# Patient Record
Sex: Male | Born: 1940 | Marital: Single | Smoking: Current every day smoker
Health system: Southern US, Community
[De-identification: ages and names within clinical notes are randomized; demographics above are authoritative.]

## PROBLEM LIST (undated history)

## (undated) DIAGNOSIS — J449 Chronic obstructive pulmonary disease, unspecified: Secondary | ICD-10-CM

## (undated) DIAGNOSIS — R0600 Dyspnea, unspecified: Secondary | ICD-10-CM

## (undated) DIAGNOSIS — I10 Essential (primary) hypertension: Secondary | ICD-10-CM

## (undated) DIAGNOSIS — I219 Acute myocardial infarction, unspecified: Secondary | ICD-10-CM

## (undated) DIAGNOSIS — I4891 Unspecified atrial fibrillation: Secondary | ICD-10-CM

## (undated) DIAGNOSIS — I499 Cardiac arrhythmia, unspecified: Secondary | ICD-10-CM

## (undated) HISTORY — PX: OTHER SURGICAL HISTORY: SHX169

## (undated) HISTORY — PX: EXPLORATION POST OPERATIVE OPEN HEART: SHX5061

---

## 2016-08-22 ENCOUNTER — Other Ambulatory Visit: Payer: Self-pay

## 2016-11-13 ENCOUNTER — Emergency Department (HOSPITAL_COMMUNITY): Payer: Medicare PPO

## 2016-11-13 ENCOUNTER — Inpatient Hospital Stay (HOSPITAL_COMMUNITY)
Admission: EM | Admit: 2016-11-13 | Discharge: 2016-11-19 | DRG: 871 | Disposition: A | Discharge status: Skilled Nursing Facility | Payer: Medicare PPO | Attending: Internal Medicine | Admitting: Internal Medicine

## 2016-11-13 ENCOUNTER — Encounter (HOSPITAL_COMMUNITY): Payer: Self-pay | Admitting: Emergency Medicine

## 2016-11-13 DIAGNOSIS — J90 Pleural effusion, not elsewhere classified: Secondary | ICD-10-CM

## 2016-11-13 DIAGNOSIS — J449 Chronic obstructive pulmonary disease, unspecified: Secondary | ICD-10-CM | POA: Diagnosis present

## 2016-11-13 DIAGNOSIS — R748 Abnormal levels of other serum enzymes: Secondary | ICD-10-CM | POA: Diagnosis not present

## 2016-11-13 DIAGNOSIS — C3491 Malignant neoplasm of unspecified part of right bronchus or lung: Secondary | ICD-10-CM | POA: Diagnosis present

## 2016-11-13 DIAGNOSIS — Z9889 Other specified postprocedural states: Secondary | ICD-10-CM

## 2016-11-13 DIAGNOSIS — J181 Lobar pneumonia, unspecified organism: Secondary | ICD-10-CM

## 2016-11-13 DIAGNOSIS — E875 Hyperkalemia: Secondary | ICD-10-CM | POA: Diagnosis present

## 2016-11-13 DIAGNOSIS — I252 Old myocardial infarction: Secondary | ICD-10-CM

## 2016-11-13 DIAGNOSIS — B958 Unspecified staphylococcus as the cause of diseases classified elsewhere: Secondary | ICD-10-CM

## 2016-11-13 DIAGNOSIS — Z833 Family history of diabetes mellitus: Secondary | ICD-10-CM | POA: Diagnosis not present

## 2016-11-13 DIAGNOSIS — I214 Non-ST elevation (NSTEMI) myocardial infarction: Secondary | ICD-10-CM | POA: Diagnosis present

## 2016-11-13 DIAGNOSIS — I712 Thoracic aortic aneurysm, without rupture: Secondary | ICD-10-CM | POA: Diagnosis present

## 2016-11-13 DIAGNOSIS — I4510 Unspecified right bundle-branch block: Secondary | ICD-10-CM | POA: Diagnosis present

## 2016-11-13 DIAGNOSIS — J910 Malignant pleural effusion: Secondary | ICD-10-CM | POA: Diagnosis present

## 2016-11-13 DIAGNOSIS — J15212 Pneumonia due to Methicillin resistant Staphylococcus aureus: Secondary | ICD-10-CM | POA: Diagnosis present

## 2016-11-13 DIAGNOSIS — I350 Nonrheumatic aortic (valve) stenosis: Secondary | ICD-10-CM | POA: Diagnosis not present

## 2016-11-13 DIAGNOSIS — N179 Acute kidney failure, unspecified: Secondary | ICD-10-CM | POA: Diagnosis present

## 2016-11-13 DIAGNOSIS — Z951 Presence of aortocoronary bypass graft: Secondary | ICD-10-CM

## 2016-11-13 DIAGNOSIS — I2510 Atherosclerotic heart disease of native coronary artery without angina pectoris: Secondary | ICD-10-CM | POA: Diagnosis present

## 2016-11-13 DIAGNOSIS — A419 Sepsis, unspecified organism: Secondary | ICD-10-CM | POA: Diagnosis present

## 2016-11-13 DIAGNOSIS — J918 Pleural effusion in other conditions classified elsewhere: Secondary | ICD-10-CM

## 2016-11-13 DIAGNOSIS — I330 Acute and subacute infective endocarditis: Secondary | ICD-10-CM | POA: Diagnosis present

## 2016-11-13 DIAGNOSIS — R7881 Bacteremia: Secondary | ICD-10-CM | POA: Diagnosis present

## 2016-11-13 DIAGNOSIS — Z8249 Family history of ischemic heart disease and other diseases of the circulatory system: Secondary | ICD-10-CM | POA: Diagnosis not present

## 2016-11-13 DIAGNOSIS — I4891 Unspecified atrial fibrillation: Secondary | ICD-10-CM

## 2016-11-13 DIAGNOSIS — F17210 Nicotine dependence, cigarettes, uncomplicated: Secondary | ICD-10-CM | POA: Diagnosis present

## 2016-11-13 DIAGNOSIS — Z9981 Dependence on supplemental oxygen: Secondary | ICD-10-CM | POA: Diagnosis not present

## 2016-11-13 DIAGNOSIS — J440 Chronic obstructive pulmonary disease with acute lower respiratory infection: Secondary | ICD-10-CM | POA: Diagnosis present

## 2016-11-13 DIAGNOSIS — A4102 Sepsis due to Methicillin resistant Staphylococcus aureus: Secondary | ICD-10-CM | POA: Diagnosis present

## 2016-11-13 DIAGNOSIS — I110 Hypertensive heart disease with heart failure: Secondary | ICD-10-CM | POA: Diagnosis present

## 2016-11-13 DIAGNOSIS — I10 Essential (primary) hypertension: Secondary | ICD-10-CM | POA: Diagnosis present

## 2016-11-13 DIAGNOSIS — J9601 Acute respiratory failure with hypoxia: Secondary | ICD-10-CM | POA: Diagnosis present

## 2016-11-13 DIAGNOSIS — I5033 Acute on chronic diastolic (congestive) heart failure: Secondary | ICD-10-CM | POA: Diagnosis present

## 2016-11-13 DIAGNOSIS — I480 Paroxysmal atrial fibrillation: Secondary | ICD-10-CM | POA: Diagnosis present

## 2016-11-13 DIAGNOSIS — B9562 Methicillin resistant Staphylococcus aureus infection as the cause of diseases classified elsewhere: Secondary | ICD-10-CM | POA: Diagnosis present

## 2016-11-13 DIAGNOSIS — R778 Other specified abnormalities of plasma proteins: Secondary | ICD-10-CM | POA: Diagnosis present

## 2016-11-13 DIAGNOSIS — R7989 Other specified abnormal findings of blood chemistry: Secondary | ICD-10-CM | POA: Diagnosis present

## 2016-11-13 DIAGNOSIS — B370 Candidal stomatitis: Secondary | ICD-10-CM | POA: Diagnosis present

## 2016-11-13 DIAGNOSIS — J189 Pneumonia, unspecified organism: Secondary | ICD-10-CM | POA: Diagnosis not present

## 2016-11-13 DIAGNOSIS — I2583 Coronary atherosclerosis due to lipid rich plaque: Secondary | ICD-10-CM

## 2016-11-13 DIAGNOSIS — E222 Syndrome of inappropriate secretion of antidiuretic hormone: Secondary | ICD-10-CM | POA: Diagnosis present

## 2016-11-13 DIAGNOSIS — Z7982 Long term (current) use of aspirin: Secondary | ICD-10-CM | POA: Diagnosis not present

## 2016-11-13 DIAGNOSIS — I351 Nonrheumatic aortic (valve) insufficiency: Secondary | ICD-10-CM | POA: Diagnosis present

## 2016-11-13 DIAGNOSIS — E871 Hypo-osmolality and hyponatremia: Secondary | ICD-10-CM | POA: Diagnosis not present

## 2016-11-13 DIAGNOSIS — I482 Chronic atrial fibrillation: Secondary | ICD-10-CM | POA: Diagnosis present

## 2016-11-13 HISTORY — DX: Cardiac arrhythmia, unspecified: I49.9

## 2016-11-13 HISTORY — DX: Unspecified atrial fibrillation: I48.91

## 2016-11-13 HISTORY — DX: Acute myocardial infarction, unspecified: I21.9

## 2016-11-13 HISTORY — DX: Chronic obstructive pulmonary disease, unspecified: J44.9

## 2016-11-13 HISTORY — DX: Dyspnea, unspecified: R06.00

## 2016-11-13 HISTORY — DX: Essential (primary) hypertension: I10

## 2016-11-13 LAB — CBC WITH DIFFERENTIAL/PLATELET
BASOS ABS: 0 10*3/uL (ref 0.0–0.1)
Basophils Relative: 0 %
EOS ABS: 0 10*3/uL (ref 0.0–0.7)
EOS PCT: 0 %
HCT: 48.2 % (ref 39.0–52.0)
HEMOGLOBIN: 15.6 g/dL (ref 13.0–17.0)
LYMPHS ABS: 0.9 10*3/uL (ref 0.7–4.0)
LYMPHS PCT: 5 %
MCH: 26.6 pg (ref 26.0–34.0)
MCHC: 32.4 g/dL (ref 30.0–36.0)
MCV: 82.1 fL (ref 78.0–100.0)
Monocytes Absolute: 0.6 10*3/uL (ref 0.1–1.0)
Monocytes Relative: 3 %
NEUTROS PCT: 92 %
Neutro Abs: 16 10*3/uL — ABNORMAL HIGH (ref 1.7–7.7)
PLATELETS: 286 10*3/uL (ref 150–400)
RBC: 5.87 MIL/uL — AB (ref 4.22–5.81)
RDW: 15.2 % (ref 11.5–15.5)
WBC: 17.5 10*3/uL — AB (ref 4.0–10.5)

## 2016-11-13 LAB — I-STAT CG4 LACTIC ACID, ED
LACTIC ACID, VENOUS: 1.86 mmol/L (ref 0.5–1.9)
Lactic Acid, Venous: 2.1 mmol/L (ref 0.5–1.9)

## 2016-11-13 LAB — OSMOLALITY: Osmolality: 296 mOsm/kg — ABNORMAL HIGH (ref 275–295)

## 2016-11-13 LAB — URINALYSIS, ROUTINE W REFLEX MICROSCOPIC
Bilirubin Urine: NEGATIVE
GLUCOSE, UA: NEGATIVE mg/dL
Ketones, ur: NEGATIVE mg/dL
LEUKOCYTES UA: NEGATIVE
NITRITE: NEGATIVE
PH: 5 (ref 5.0–8.0)
PROTEIN: NEGATIVE mg/dL
SPECIFIC GRAVITY, URINE: 1.01 (ref 1.005–1.030)
Squamous Epithelial / LPF: NONE SEEN
WBC, UA: NONE SEEN WBC/hpf (ref 0–5)

## 2016-11-13 LAB — COMPREHENSIVE METABOLIC PANEL
ALBUMIN: 2.1 g/dL — AB (ref 3.5–5.0)
ALT: 25 U/L (ref 17–63)
ANION GAP: 9 (ref 5–15)
AST: 37 U/L (ref 15–41)
Alkaline Phosphatase: 186 U/L — ABNORMAL HIGH (ref 38–126)
BUN: 53 mg/dL — ABNORMAL HIGH (ref 6–20)
CO2: 22 mmol/L (ref 22–32)
Calcium: 8.8 mg/dL — ABNORMAL LOW (ref 8.9–10.3)
Chloride: 94 mmol/L — ABNORMAL LOW (ref 101–111)
Creatinine, Ser: 1.46 mg/dL — ABNORMAL HIGH (ref 0.61–1.24)
GFR calc non Af Amer: 45 mL/min — ABNORMAL LOW (ref >60)
GFR, EST AFRICAN AMERICAN: 52 mL/min — AB (ref >60)
GLUCOSE: 117 mg/dL — AB (ref 65–99)
POTASSIUM: 5.9 mmol/L — AB (ref 3.5–5.1)
SODIUM: 125 mmol/L — AB (ref 135–145)
TOTAL PROTEIN: 6.9 g/dL (ref 6.5–8.1)
Total Bilirubin: 1 mg/dL (ref 0.3–1.2)

## 2016-11-13 LAB — BRAIN NATRIURETIC PEPTIDE: B Natriuretic Peptide: 193.6 pg/mL — ABNORMAL HIGH (ref 0.0–100.0)

## 2016-11-13 LAB — MAGNESIUM: MAGNESIUM: 2.1 mg/dL (ref 1.7–2.4)

## 2016-11-13 LAB — TROPONIN I
TROPONIN I: 0.07 ng/mL — AB (ref <0.03)
Troponin I: 0.04 ng/mL (ref <0.03)

## 2016-11-13 LAB — POTASSIUM: POTASSIUM: 5.9 mmol/L — AB (ref 3.5–5.1)

## 2016-11-13 LAB — PROTIME-INR
INR: 1.14
PROTHROMBIN TIME: 14.7 s (ref 11.4–15.2)

## 2016-11-13 MED ORDER — TIOTROPIUM BROMIDE MONOHYDRATE 18 MCG IN CAPS
18.0000 ug | ORAL_CAPSULE | Freq: Every day | RESPIRATORY_TRACT | Status: DC
  Administered 2016-11-15 – 2016-11-18 (×4): 18 ug via RESPIRATORY_TRACT
  Filled 2016-11-13: qty 5

## 2016-11-13 MED ORDER — NYSTATIN 100000 UNIT/ML MT SUSP
5.0000 mL | Freq: Four times a day (QID) | OROMUCOSAL | Status: DC
  Administered 2016-11-13: 500000 [IU] via ORAL
  Filled 2016-11-13: qty 5

## 2016-11-13 MED ORDER — ALBUTEROL SULFATE (2.5 MG/3ML) 0.083% IN NEBU: 2.5000 mg | INHALATION_SOLUTION | RESPIRATORY_TRACT | Status: DC | PRN

## 2016-11-13 MED ORDER — METOPROLOL TARTRATE 12.5 MG HALF TABLET
12.5000 mg | ORAL_TABLET | Freq: Two times a day (BID) | ORAL | Status: DC
  Administered 2016-11-14 – 2016-11-16 (×6): 12.5 mg via ORAL
  Filled 2016-11-13 (×6): qty 1

## 2016-11-13 MED ORDER — ONDANSETRON HCL 4 MG/2ML IJ SOLN: 4.0000 mg | Freq: Four times a day (QID) | INTRAMUSCULAR | Status: DC | PRN

## 2016-11-13 MED ORDER — DEXTROSE 5 % IV SOLN
1.0000 g | Freq: Once | INTRAVENOUS | Status: AC
  Administered 2016-11-13: 1 g via INTRAVENOUS
  Filled 2016-11-13: qty 10

## 2016-11-13 MED ORDER — PREDNISONE 20 MG PO TABS
40.0000 mg | ORAL_TABLET | Freq: Every day | ORAL | Status: DC
  Administered 2016-11-14: 40 mg via ORAL
  Filled 2016-11-13: qty 2

## 2016-11-13 MED ORDER — LEVOFLOXACIN IN D5W 500 MG/100ML IV SOLN
500.0000 mg | Freq: Once | INTRAVENOUS | Status: AC
  Administered 2016-11-13: 500 mg via INTRAVENOUS
  Filled 2016-11-13: qty 100

## 2016-11-13 MED ORDER — AZITHROMYCIN 500 MG IV SOLR
500.0000 mg | INTRAVENOUS | Status: DC
  Administered 2016-11-14: 500 mg via INTRAVENOUS
  Filled 2016-11-13 (×2): qty 500

## 2016-11-13 MED ORDER — DEXAMETHASONE SODIUM PHOSPHATE 10 MG/ML IJ SOLN
10.0000 mg | Freq: Once | INTRAMUSCULAR | Status: AC
Start: 2016-11-13 — End: 2016-11-13
  Administered 2016-11-13: 10 mg via INTRAVENOUS
  Filled 2016-11-13: qty 1

## 2016-11-13 MED ORDER — ONDANSETRON HCL 4 MG PO TABS: 4.0000 mg | ORAL_TABLET | Freq: Four times a day (QID) | ORAL | Status: DC | PRN

## 2016-11-13 MED ORDER — FUROSEMIDE 10 MG/ML IJ SOLN
40.0000 mg | INTRAMUSCULAR | Status: AC
  Administered 2016-11-13: 40 mg via INTRAVENOUS
  Filled 2016-11-13: qty 4

## 2016-11-13 MED ORDER — ASPIRIN EC 81 MG PO TBEC
81.0000 mg | DELAYED_RELEASE_TABLET | Freq: Every day | ORAL | Status: DC
  Administered 2016-11-14 – 2016-11-19 (×6): 81 mg via ORAL
  Filled 2016-11-13 (×6): qty 1

## 2016-11-13 MED ORDER — IOPAMIDOL (ISOVUE-300) INJECTION 61%
INTRAVENOUS | Status: AC
  Administered 2016-11-14: 100 mL via INTRAVENOUS
  Filled 2016-11-13: qty 100

## 2016-11-13 MED ORDER — SODIUM CHLORIDE 0.9 % IV SOLN
INTRAVENOUS | Status: DC
  Administered 2016-11-13: 23:00:00 via INTRAVENOUS

## 2016-11-13 MED ORDER — IPRATROPIUM-ALBUTEROL 0.5-2.5 (3) MG/3ML IN SOLN
3.0000 mL | Freq: Once | RESPIRATORY_TRACT | Status: AC
  Administered 2016-11-13: 3 mL via RESPIRATORY_TRACT
  Filled 2016-11-13: qty 3

## 2016-11-13 MED ORDER — DEXTROSE 5 % IV SOLN
1.0000 g | INTRAVENOUS | Status: DC
  Administered 2016-11-14: 1 g via INTRAVENOUS
  Filled 2016-11-13 (×2): qty 10

## 2016-11-13 MED ORDER — ACETAMINOPHEN 325 MG PO TABS: 650.0000 mg | ORAL_TABLET | Freq: Four times a day (QID) | ORAL | Status: DC | PRN

## 2016-11-13 MED ORDER — TAMSULOSIN HCL 0.4 MG PO CAPS
0.4000 mg | ORAL_CAPSULE | Freq: Two times a day (BID) | ORAL | Status: DC
  Administered 2016-11-14 – 2016-11-19 (×12): 0.4 mg via ORAL
  Filled 2016-11-13 (×12): qty 1

## 2016-11-13 MED ORDER — NON FORMULARY: Freq: Every day | Status: DC

## 2016-11-13 MED ORDER — ENOXAPARIN SODIUM 40 MG/0.4ML ~~LOC~~ SOLN: 40.0000 mg | SUBCUTANEOUS | Status: DC

## 2016-11-13 MED ORDER — ARFORMOTEROL TARTRATE 15 MCG/2ML IN NEBU
15.0000 ug | INHALATION_SOLUTION | Freq: Two times a day (BID) | RESPIRATORY_TRACT | Status: DC
  Administered 2016-11-14 – 2016-11-18 (×8): 15 ug via RESPIRATORY_TRACT
  Filled 2016-11-13 (×10): qty 2

## 2016-11-13 MED ORDER — NICOTINE 21 MG/24HR TD PT24: 21.0000 mg | MEDICATED_PATCH | Freq: Every day | TRANSDERMAL | Status: DC

## 2016-11-13 MED ORDER — ACETAMINOPHEN 650 MG RE SUPP: 650.0000 mg | Freq: Four times a day (QID) | RECTAL | Status: DC | PRN

## 2016-11-13 MED ORDER — GABAPENTIN 600 MG PO TABS
600.0000 mg | ORAL_TABLET | Freq: Three times a day (TID) | ORAL | Status: DC
  Administered 2016-11-14 – 2016-11-19 (×17): 600 mg via ORAL
  Filled 2016-11-13 (×17): qty 1

## 2016-11-13 NOTE — ED Notes (Signed)
EDP aware of BP.

## 2016-11-13 NOTE — ED Notes (Signed)
Attempted report x1. 

## 2016-11-13 NOTE — ED Notes (Signed)
ED Provider at bedside. 

## 2016-11-13 NOTE — Progress Notes (Signed)
Pharmacy Antibiotic Note  Mike Becker is a 76 y.o. male admitted on 11/13/2016 with pneumonia.  Pharmacy has been consulted for Ceftriaxone/Azithromycin dosing. WBC elevated. CXR with airspace opacity.   Plan: -Ceftriaxone 1g IV q24h -Azithromycin 500 mg IV q24h -Trend WBC, temp -F/U infectious work-up  Height: 6\' 3"  (190.5 cm) Weight: 178 lb (80.7 kg) IBW/kg (Calculated) : 84.5  Temp (24hrs), Avg:98.4 F (36.9 C), Min:97.3 F (36.3 C), Max:99.4 F (37.4 C)   Recent Labs Lab 11/13/16 1714 11/13/16 1729 11/13/16 1944  WBC 17.5*  --   --   CREATININE 1.46*  --   --   LATICACIDVEN  --  2.10* 1.86    Estimated Creatinine Clearance: 48.4 mL/min (A) (by C-G formula based on SCr of 1.46 mg/dL (H)).    No Known Allergies   Mike Becker 11/13/2016 10:31 PM

## 2016-11-13 NOTE — ED Notes (Signed)
Per main lab, will add on BNP

## 2016-11-13 NOTE — ED Notes (Addendum)
Per 3W Charge RN and [facility], pt inpatient bed in need of approval. Pt to remain in ED and continue to receive care in ED until transported upstairs. Explained delay to family.

## 2016-11-13 NOTE — ED Notes (Signed)
XR at bedside

## 2016-11-13 NOTE — ED Notes (Signed)
Admitting at bedside 

## 2016-11-13 NOTE — ED Provider Notes (Signed)
[facility] DEPT Provider Note   CSN: [id number] Arrival date & time: 11/13/16  [place]     History   Chief Complaint Chief Complaint  Patient presents with  . Shortness of Breath  . Leg Swelling  . Weakness  . Diarrhea    HPI Mike Becker is a 76 y.o. male.  76 year old male with past medical history including COPD, CAD s/p MI and CABG, A fib not on anticoagulation who p/w shortness of breath, leg swelling, and diarrhea. Patient reports a 2 week history of progressively worsening shortness of breath. He chronically has a productive cough that may be slightly worse recently. He reports brown sputum production that no associated fevers or chest pain. His shortness of breath is worse with exertion. He endorses orthopnea and paroxysmal nocturnal dyspnea as well as 2 weeks of worsening bilateral lower extremity edema. He reports 1 week of nonbloody diarrhea, no associated vomiting. No recent travel or sick contacts. He states that a long time ago he was in the ED for atrial fibrillation and they wanted to start him on several toe but he never took the medication. He has been out of Lasix for the past one month but has taken the rest of his medications.   The history is provided by the patient.  Shortness of Breath   Weakness  Associated symptoms include shortness of breath.  Diarrhea      Past Medical History:  Diagnosis Date  . A-fib ([place])   . COPD (chronic obstructive pulmonary disease) ([place])   . MI (myocardial infarction) [facility])     Patient Active Problem List   Diagnosis Date Noted  . Sepsis ([place]) 11/13/2016    Past Surgical History:  Procedure Laterality Date  . EXPLORATION POST OPERATIVE OPEN HEART    . triple bypass         Home Medications    Prior to Admission medications   Medication Sig Start Date End Date Taking? Authorizing Provider  aspirin EC 81 MG tablet Take 81 mg by mouth daily.   Yes [provider]  gabapentin (NEURONTIN) 600 MG  tablet Take 600 mg by mouth 3 (three) times daily.   Yes [provider]  ibuprofen (ADVIL,MOTRIN) 200 MG tablet Take 200 mg by mouth every 6 (six) hours as needed for headache (pain).   Yes [provider]  lisinopril (PRINIVIL,ZESTRIL) 40 MG tablet Take 20 mg by mouth at bedtime.   Yes [provider]  metoprolol tartrate (LOPRESSOR) 25 MG tablet Take 12.5 mg by mouth 2 (two) times daily.   Yes [provider]  mometasone (ASMANEX) 220 MCG/INH inhaler Inhale 2 puffs into the lungs at bedtime. Rinse mouth well with water after each use   Yes [provider]  Omega-3 Fatty Acids (OMEGA 3 PO) Take 1 capsule by mouth daily.   Yes [provider]  OVER THE COUNTER MEDICATION Take 1 capsule by mouth See admin instructions. Take 1 capsule by mouth every morning - supplement for brain/memory   Yes [provider]  OVER THE COUNTER MEDICATION Place 1 drop into both eyes daily. Over the counter eye drop for red eyes   Yes [provider]  tamsulosin (FLOMAX) 0.4 MG CAPS capsule Take 0.4 mg by mouth 2 (two) times daily.   Yes [provider]  Tiotropium Bromide-Olodaterol (STIOLTO RESPIMAT) 2.5-2.5 MCG/ACT AERS Inhale 2 puffs into the lungs daily.   Yes [provider]  furosemide (LASIX) 20 MG tablet Take 20 mg by mouth  See admin instructions. Take 1 tablet (20 mg) by mouth daily as needed for leg swelling or one pound weight gain in 24 hours    [provider]    Family History No family history on file.  Social History Social History  Substance Use Topics  . Smoking status: Current Every Day Smoker  . Smokeless tobacco: Never Used  . Alcohol use No     Allergies   Patient has no known allergies.   Review of Systems Review of Systems  Respiratory: Positive for shortness of breath.   Gastrointestinal: Positive for diarrhea.  Neurological: Positive for weakness.   All other systems reviewed  and are negative except that which was mentioned in HPI   Physical Exam Updated Vital Signs BP 123/76   Pulse (!) 102   Temp (S) 99.4 F (37.4 C) (Rectal)   Resp (!) 22   Ht 6\' 3"  (1.905 m)   Wt 80.7 kg (178 lb)   SpO2 96%   BMI 22.25 kg/m   Physical Exam  Constitutional: He is oriented to person, place, and time. He appears well-developed. No distress.  Thin, chronically ill appearing but NAD  HENT:  Head: Normocephalic and atraumatic.  Eyes: Conjunctivae are normal. Pupils are equal, round, and reactive to light.  Neck: Neck supple.  Cardiovascular: Normal heart sounds.  An irregularly irregular rhythm present. Tachycardia present.   No murmur heard. Pulmonary/Chest:  Mildly increased WOB, expiratory wheezes, crackles in b/l bases, severely diminished R>L lung  Abdominal: Soft. Bowel sounds are normal. He exhibits no distension. There is no tenderness.  Musculoskeletal: He exhibits edema (2+ pitting to b/l knees).  Neurological: He is alert and oriented to person, place, and time.  Fluent speech  Skin: Skin is warm and dry.  Psychiatric: He has a normal mood and affect. Judgment normal.  Nursing note and vitals reviewed.    ED Treatments / Results  Labs (all labs ordered are listed, but only abnormal results are displayed) Labs Reviewed  COMPREHENSIVE METABOLIC PANEL - Abnormal; Notable for the following:       Result Value   Sodium 125 (*)    Potassium 5.9 (*)    Chloride 94 (*)    Glucose, Bld 117 (*)    BUN 53 (*)    Creatinine, Ser 1.46 (*)    Calcium 8.8 (*)    Albumin 2.1 (*)    Alkaline Phosphatase 186 (*)    GFR calc non Af Amer 45 (*)    GFR calc Af Amer 52 (*)    All other components within normal limits  CBC WITH DIFFERENTIAL/PLATELET - Abnormal; Notable for the following:    WBC 17.5 (*)    RBC 5.87 (*)    Neutro Abs 16.0 (*)    All other components within normal limits  URINALYSIS, ROUTINE W REFLEX MICROSCOPIC - Abnormal; Notable for the  following:    Hgb urine dipstick MODERATE (*)    Bacteria, UA RARE (*)    All other components within normal limits  TROPONIN I - Abnormal; Notable for the following:    Troponin I 0.07 (*)    All other components within normal limits  POTASSIUM - Abnormal; Notable for the following:    Potassium 5.9 (*)    All other components within normal limits  BRAIN NATRIURETIC PEPTIDE - Abnormal; Notable for the following:    B Natriuretic Peptide 193.6 (*)    All other components within normal limits  I-STAT CG4 LACTIC ACID,  ED - Abnormal; Notable for the following:    Lactic Acid, Venous 2.10 (*)    All other components within normal limits  CULTURE, BLOOD (ROUTINE X 2)  CULTURE, BLOOD (ROUTINE X 2)  URINE CULTURE  PROTIME-INR  I-STAT CG4 LACTIC ACID, ED    EKG  EKG Interpretation  Date/Time:  Saturday November 13 2016 16:52:20 EDT Ventricular Rate:  145 PR Interval:    QRS Duration: 124 QT Interval:  316 QTC Calculation: 490 R Axis:   128 Text Interpretation:  Atrial fibrillation with rapid ventricular response Right bundle branch block Abnormal ECG No previous ECGs available Confirmed by Theotis Burrow [phone]) on 11/13/2016 5:12:15 PM       Radiology Dg Chest Port 1 View  Result Date: 11/13/2016 CLINICAL DATA:  Weakness and swelling to both feet. Shortness of breath. EXAM: PORTABLE CHEST 1 VIEW COMPARISON:  None. FINDINGS: Left lung appears hyperexpanded with underlying chronic interstitial changes. There is right base collapse/ consolidation with moderate right pleural effusion. Patient is status post CABG. Bones are diffusely demineralized. Telemetry leads overlie the chest. IMPRESSION: 1. Airspace opacity at the right base with associated moderate right pleural effusion. Given unilateral round IV of findings, neoplasm must be considered. CT chest with contrast may prove helpful to further evaluate. Electronically Signed   By: Misty Stanley M.D.   On: 11/13/2016 17:36     Procedures .Critical Care Performed by: Sharlett Iles Authorized by: Sharlett Iles   Critical care provider statement:    Critical care time (minutes):  40   Critical care time was exclusive of:  Separately billable procedures and treating other patients   Critical care was necessary to treat or prevent imminent or life-threatening deterioration of the following conditions:  Sepsis, respiratory failure and cardiac failure   Critical care was time spent personally by me on the following activities:  Development of treatment plan with patient or surrogate, evaluation of patient's response to treatment, examination of patient, obtaining history from patient or surrogate, ordering and performing treatments and interventions, ordering and review of laboratory studies, ordering and review of radiographic studies and re-evaluation of patient's condition   (including critical care time)  Medications Ordered in ED Medications  cefTRIAXone (ROCEPHIN) 1 g in dextrose 5 % 50 mL IVPB (not administered)  nystatin (MYCOSTATIN) 100000 UNIT/ML suspension 500,000 Units (500,000 Units Oral Given 11/13/16 1947)  furosemide (LASIX) injection 40 mg (40 mg Intravenous Given 11/13/16 1747)  ipratropium-albuterol (DUONEB) 0.5-2.5 (3) MG/3ML nebulizer solution 3 mL (3 mLs Nebulization Given 11/13/16 1748)  dexamethasone (DECADRON) injection 10 mg (10 mg Intravenous Given 11/13/16 1747)  cefTRIAXone (ROCEPHIN) 1 g in dextrose 5 % 50 mL IVPB (0 g Intravenous Stopped 11/13/16 1827)  levofloxacin (LEVAQUIN) IVPB 500 mg (0 mg Intravenous Stopped 11/13/16 1936)     Initial Impression / Assessment and Plan / ED Course  I have reviewed the triage vital signs and the nursing notes.  Pertinent labs & imaging results that were available during my care of the patient were reviewed by me and considered in my medical decision making (see chart for details).    Pt w/ CAD, COPD not on O2, h/o A fib but not  anticoagulated p/w worsening SOB, leg swelling, and diarrhea. At triage, he was 85% on RA, placed on 5L [place]. Initial BP 88/59, sBP improved to 107 during my exam. HR irregular, a fib on monitor, rate ~100. He had wheezes, crackles, and evidence of volume overload on exam. Initial lactate 2.1,  initiated code sepsis given abnormal VS, hypoxia, and productive cough. Obtained blood and urine cultures, gave levaquin, ceftriaxone, Levaquin, Decadron, as well as dose of Lasix.  His labwork shows mildly elevated troponin at 0.07, potassium 5.9, sodium 125, reacting 1.46, WBC 17.5, BNP 193. His chest x-ray shows right-sided opacity with associated pleural effusion. I do feel that he may have underlying pneumonia given his productive cough and hypoxia. He may ultimately need a CT scan of his chest to rule out underlying neoplasm. In addition, he is clinically volume overloaded on exam and given the fact that he has not taken his Lasix for a month, and it may have been in and out of A. fib with RVR, I feel that he also has a component of volume overload. I have held on giving him fluids because of his tenuous respiratory status and clinical volume overload. His blood pressure has remained stable with heart rate near 100. His respiratory status has not changed during ED course. I discussed admission with [facility] hospitalist, Dr. Loleta Books, who will initiate anticoagulation as appropriate. Pt admitted for further treatment. Final Clinical Impressions(s) / ED Diagnoses   Final diagnoses:  Oral thrush  Sepsis, due to unspecified organism [facility])  NSTEMI (non-ST elevated myocardial infarction) [facility])  Atrial fibrillation, unspecified type ([place])  Community acquired pneumonia, unspecified laterality    New Prescriptions New Prescriptions   No medications on file     Ruchi Stoney, Wenda Overland, MD 11/13/16 2024

## 2016-11-13 NOTE — ED Notes (Signed)
Pt given [place] sandwich per Dr. Rex Kras.

## 2016-11-13 NOTE — ED Triage Notes (Signed)
Pt c/o weakness, swelling to B/L feet, shortness of breath and diarrhea x few days.

## 2016-11-13 NOTE — H&P (Signed)
History and Physical  Patient Name: Mike Becker     WUJ:[id number]    DOB: [birth year]    DOA: 11/13/2016 PCP: Patient, No Pcp Per  Patient coming from: Home  Chief Complaint: Weakness, dyspnea, orthopnea, cough      HPI: Mike Becker is a 76 y.o. male with a past medical history significant for CAD s/p CABG 2004, COPD not on home O2, HTN and Afib declined AC who presents with 1 week progressive cough, weakness, dyspnea, orthopnea.  The patient was in his usual state of health until about the last week when he developed increased cough, progressive dyspnea on exertion and weakness. In the last few days also his legs started swelling and he has noticed dyspnea with lying flat, relieved with sitting up, so today he came to the emergency room. He has had no fever, chills, no change in his chronic sputum.  No chest pain, palpitations, chest discomfort.  ED course: -Temp 99.64F, heart rate 101, respirations 28, blood pressure 110/80, pulse oximetry 85% on room air, normalized on 4 L [place] -Na 125, K 5.9, Cr 1.46 (baseline unknown), WBC 17.5K, Hgb 15.6 -Lactic acid 2.1 -INR normal -BNP 193, troponin 0.07 -UA rare RBCs, no pyuria or bacteria -1V CXR showed right base opacity, rounded and masslike with parapneumonic effusion -ECG showed atrial fibrillation rate 145, RBBB, no old for comparison -He was given ceftriaxone, Decadron, Lasix, Levaquin, nystatin and TRH was asked to evaluate for CHF flare   He lived in [place], only recently moved to [place]. He had CABG in 2004, history of hypertension, history of A. fib for which she has declined anti-coagulation. He has no known history of CHF, no known history of kidney disease.        ROS: Review of Systems  Constitutional: Positive for malaise/fatigue. Negative for chills, diaphoresis and fever.  Respiratory: Positive for cough, sputum production (chronic unchanged) and shortness of breath.   Cardiovascular: Positive for  orthopnea and leg swelling. Negative for chest pain and palpitations.  Gastrointestinal: Negative for abdominal pain, nausea and vomiting.  Neurological: Positive for weakness. Negative for dizziness and loss of consciousness.  All other systems reviewed and are negative.         Past Medical History:  Diagnosis Date  . A-fib ([place])   . COPD (chronic obstructive pulmonary disease) ([place])   . Dysrhythmia   . Hypertension   . MI (myocardial infarction) [facility])     Past Surgical History:  Procedure Laterality Date  . EXPLORATION POST OPERATIVE OPEN HEART    . triple bypass      Social History: Patient lives alone.  The patient walks unassisted.  Active smoker.  Worked as a Tour manager.  No alcohol.  POA would be son who is local.  No Known Allergies  Family history: family history includes Congenital heart disease in his mother; Congestive Heart Failure in his mother; Diabetes in his brother and mother.  Prior to Admission medications   Medication Sig Start Date End Date Taking? Authorizing Provider  aspirin EC 81 MG tablet Take 81 mg by mouth daily.   Yes [provider]  gabapentin (NEURONTIN) 600 MG tablet Take 600 mg by mouth 3 (three) times daily.   Yes [provider]  ibuprofen (ADVIL,MOTRIN) 200 MG tablet Take 200 mg by mouth every 6 (six) hours as needed for headache (pain).   Yes [provider]  lisinopril (PRINIVIL,ZESTRIL) 40 MG tablet Take 20 mg by mouth at bedtime.   Yes  [provider]  metoprolol tartrate (LOPRESSOR) 25 MG tablet Take 12.5 mg by mouth 2 (two) times daily.   Yes [provider]  mometasone (ASMANEX) 220 MCG/INH inhaler Inhale 2 puffs into the lungs at bedtime. Rinse mouth well with water after each use   Yes [provider]  Omega-3 Fatty Acids (OMEGA 3 PO) Take 1 capsule by mouth daily.   Yes [provider]  OVER THE COUNTER MEDICATION Take 1 capsule by mouth See admin  instructions. Take 1 capsule by mouth every morning - supplement for brain/memory   Yes [provider]  OVER THE COUNTER MEDICATION Place 1 drop into both eyes daily. Over the counter eye drop for red eyes   Yes [provider]  tamsulosin (FLOMAX) 0.4 MG CAPS capsule Take 0.4 mg by mouth 2 (two) times daily.   Yes [provider]  Tiotropium Bromide-Olodaterol (STIOLTO RESPIMAT) 2.5-2.5 MCG/ACT AERS Inhale 2 puffs into the lungs daily.   Yes [provider]  furosemide (LASIX) 20 MG tablet Take 20 mg by mouth See admin instructions. Take 1 tablet (20 mg) by mouth daily as needed for leg swelling or one pound weight gain in 24 hours    [provider]       Physical Exam: BP 119/76   Pulse 95   Temp (S) 99.4 F (37.4 C) (Rectal)   Resp (!) 23   Ht 6\' 3"  (1.905 m)   Wt 80.7 kg (178 lb)   SpO2 94%   BMI 22.25 kg/m  General appearance: Thin elderly adult male, alert and in mild distress from malaise.   Eyes: Anicteric, conjunctiva pink, lids and lashes normal. PERRL.    ENT: No nasal deformity, discharge, epistaxis.  Hearing normal. OP moist, no whitish plaques.   Neck: No neck masses.  Trachea midline.  No thyromegaly/tenderness. Lymph: No cervical or supraclavicular lymphadenopathy. Skin: Warm and dry.  No jaundice.  No suspicious rashes or lesions. Cardiac: Tachycardic, irregular, nl S1-S2, no murmurs appreciated.  Capillary refill is brisk.  JVP elevated. 2+ bilateral LE edema.  Radial and DP pulses 2+ and symmetric. Respiratory: Tachypneic, increased effort, rales at bases, no wheezes, very diminished at right base. Abdomen: Abdomen soft.  No TTP. No ascites, distension, hepatosplenomegaly.   MSK: No deformities or effusions.  No cyanosis or clubbing. Neuro: Cranial nerves 3-12 intact.  Sensation intact to light touch. Speech is fluent.  Muscle strength 5/5 and symmetric.    Psych: Sensorium intact and responding to questions, attention  normal.  Behavior appropriate.  Affect normal.  Judgment and insight appear normal.     Labs on Admission:  I have personally reviewed following labs and imaging studies: CBC:  Recent Labs Lab 11/13/16 1714  WBC 17.5*  NEUTROABS 16.0*  HGB 15.6  HCT 48.2  MCV 82.1  PLT 161   Basic Metabolic Panel:  Recent Labs Lab 11/13/16 1714 11/13/16 1828  NA 125*  --   K 5.9* 5.9*  CL 94*  --   CO2 22  --   GLUCOSE 117*  --   BUN 53*  --   CREATININE 1.46*  --   CALCIUM 8.8*  --    GFR: Estimated Creatinine Clearance: 48.4 mL/min (A) (by C-G formula based on SCr of 1.46 mg/dL (H)).  Liver Function Tests:  Recent Labs Lab 11/13/16 1714  AST 37  ALT 25  ALKPHOS 186*  BILITOT 1.0  PROT 6.9  ALBUMIN 2.1*   Coagulation Profile:  Recent  Labs Lab 11/13/16 1714  INR 1.14   Cardiac Enzymes:  Recent Labs Lab 11/13/16 1828  TROPONINI 0.07*   Sepsis Labs: Lactate 2.1       Radiological Exams on Admission: Personally reviewed CXR shows pneumonia and efffusion, CT follow up recommended: Dg Chest Port 1 View  Result Date: 11/13/2016 CLINICAL DATA:  Weakness and swelling to both feet. Shortness of breath. EXAM: PORTABLE CHEST 1 VIEW COMPARISON:  None. FINDINGS: Left lung appears hyperexpanded with underlying chronic interstitial changes. There is right base collapse/ consolidation with moderate right pleural effusion. Patient is status post CABG. Bones are diffusely demineralized. Telemetry leads overlie the chest. IMPRESSION: 1. Airspace opacity at the right base with associated moderate right pleural effusion. Given unilateral round IV of findings, neoplasm must be considered. CT chest with contrast may prove helpful to further evaluate. Electronically Signed   By: Misty Stanley M.D.   On: 11/13/2016 17:36    EKG: Independently reviewed. Rate 145, Afib, RBBB.  No previous for comparison.        Assessment/Plan  1. Sepsis:  Suspected source pneumonia. Organism  unknown.   Patient meets criteria given tachycardia, tachypnea, leukocytosis, and evidence of organ dysfunction.  Lactate 2.1 mmol/L and repeat completed within 6 hours.  Despite an initial BP <90/60 mmHg, this was in the context of tachycardia to 150 bpm, it resolved without fluids (when HR normalized, which also did so spontaneously).  Furthermore, patient appeared to be mentating normally, and his HR and BP remained constant while in the ER despite no fluid resuscitation, therefore although the patient has early sepsis, he DOES NOT have septic shock at this time and 30 cc/kg fluid bolus was withheld.   -Sepsis bundle utilized:  -Blood and urine cultures drawn  -Antibiotics: Ceftriaxone and azithromycin  -Repeat renal function and complete blood count in AM  -Code SEPSIS called to E-link  -Obtain sputum culture -Obtain urine legionella and strep pneumo antigens -Obtain Procalcitonin -Prednisone for 5 days    2. Pleural effusion and possible pulmonary nodule:  -CT chest with contrast ordered -Ultrasound thoracentesis was lab studies ordered for tomorrow  3. Elevated troponin:  Suspect this is A. fib related or otherwise demand ischemia, not ACS. -Trend troponins  4. Elevated creatinine, suspect AKI:  Baseline creatinine unknown. UA Bland. -Check fractional excretion of sodium -Gentle fluids overnight -Hold ACE  5. Atrial fibrillation with rapid ventricular rate:  CHADS2-VASc 4. Not on anticoagulation, patient has declined it I nthe past because of "the long term effects of blood thinners that I read about". This was discussed again, risks benefits of AC. Patient reluctant. -Continue metoprolol  6. Hyperkalemia:  Mild. -Monitor on tele -Gentle fluids -Trend K -Hold ACE  7. Hyponatremia:  Unclear cause.  Lung mass noted.  Overall appears edematous. -Check urine sodium, Posm/Uosm ratio  8. Acute congestive heart failure: Presumed diastolic, EF unknown.  Feel compelled for  fluids insetting of AKI, sepsis. -Obtain echocardiogram -Caution with fluids -I/Os, daily weights -Hold furosemide  9. Hypertension: -Continue metoprolol -Hold ACEi given AKI  10. COPD: -Continue Stiolto, asmanex  11. Other medications: -Continue Flomax -Continue gabapentin           DVT prophylaxis: Lovenox  Code Status: FULL  Family Communication: Sisters at bedside, overnight plan discussed, all questions answered.  Disposition Plan: Anticipate CT chest, IV antibiotics.  US thoracentesis if able tomorrow.  Monitor fluid balance carefully, may need diuresis, likely.   Consults called: None Admission status: INPATIENT  Medical decision making: Patient seen at 8:00 PM on 11/13/2016.  The patient was discussed with Dr. Rex Kras.  What exists of the patient's chart was reviewed in depth and summarized above.  Clinical condition: stable hemodynamically at this time.        Edwin Dada [facility] Pager [phone]        At the time of admission, it appears that the appropriate admission status for this patient is INPATIENT. This is judged to be reasonable and necessary in order to provide the required intensity of service to ensure the patient's safety given the presenting symptoms, physical exam findings, and initial radiographic and laboratory data in the context of their chronic comorbidities.  Together, these circumstances are felt to place him at high risk for further clinical deterioration threatening life, limb, or organ.   Patient requires inpatient status due to high intensity of service, high risk for further deterioration and high frequency of surveillance required because of this severe exacerbation of their chronic organ failure and acute illness that poses a threat to life, limb or bodily function.  I certify that at the point of admission it is my clinical judgment that the patient will require inpatient hospital care spanning beyond 2  midnights from the point of admission and that early discharge would result in unnecessary risk of decompensation and readmission or threat to life, limb or bodily function.

## 2016-11-13 NOTE — ED Notes (Signed)
EDP aware of pt troponin.

## 2016-11-13 NOTE — ED Notes (Addendum)
Pt placed on 6 L [place], O2 sat increased from 90% on 4L [place] to 92-94% on 6 L.

## 2016-11-14 ENCOUNTER — Inpatient Hospital Stay (HOSPITAL_COMMUNITY): Payer: Medicare PPO

## 2016-11-14 DIAGNOSIS — I350 Nonrheumatic aortic (valve) stenosis: Secondary | ICD-10-CM

## 2016-11-14 LAB — CREATININE, URINE, RANDOM: Creatinine, Urine: 40.28 mg/dL

## 2016-11-14 LAB — ECHOCARDIOGRAM COMPLETE
CHL CUP DOP CALC LVOT VTI: 19.5 cm
CHL CUP MV DEC (S): 254
E/e' ratio: 7.51
EWDT: 254 ms
FS: 31 % (ref 28–44)
Height: 75 in
IV/PV OW: 0.86
LA diam index: 1.9 cm/m2
LA vol A4C: 41.5 ml
LA vol index: 22.4 mL/m2
LASIZE: 40 mm
LAVOL: 47 mL
LEFT ATRIUM END SYS DIAM: 40 mm
LV E/e' medial: 7.51
LV PW d: 11.4 mm — AB (ref 0.6–1.1)
LV e' LATERAL: 11.5 cm/s
LVEEAVG: 7.51
LVOT area: 4.52 cm2
LVOT diameter: 24 mm
LVOT peak vel: 80.8 cm/s
LVOTSV: 88 mL
MV Peak grad: 3 mmHg
MVPKAVEL: 101 m/s
MVPKEVEL: 86.4 m/s
RV LATERAL S' VELOCITY: 8.93 cm/s
TAPSE: 20.3 mm
TDI e' lateral: 11.5
TDI e' medial: 6.87
Weight: 2870.4 oz

## 2016-11-14 LAB — CBC
HEMATOCRIT: 43.7 % (ref 39.0–52.0)
HEMOGLOBIN: 13.9 g/dL (ref 13.0–17.0)
MCH: 26.3 pg (ref 26.0–34.0)
MCHC: 31.8 g/dL (ref 30.0–36.0)
MCV: 82.6 fL (ref 78.0–100.0)
Platelets: 238 10*3/uL (ref 150–400)
RBC: 5.29 MIL/uL (ref 4.22–5.81)
RDW: 15.2 % (ref 11.5–15.5)
WBC: 13.7 10*3/uL — AB (ref 4.0–10.5)

## 2016-11-14 LAB — LACTATE DEHYDROGENASE, PLEURAL OR PERITONEAL FLUID: LD, Fluid: 597 U/L — ABNORMAL HIGH (ref 3–23)

## 2016-11-14 LAB — PROTIME-INR
INR: 1.15
PROTHROMBIN TIME: 14.8 s (ref 11.4–15.2)

## 2016-11-14 LAB — TSH: TSH: 0.292 u[IU]/mL — AB (ref 0.350–4.500)

## 2016-11-14 LAB — BASIC METABOLIC PANEL
ANION GAP: 6 (ref 5–15)
BUN: 53 mg/dL — ABNORMAL HIGH (ref 6–20)
CHLORIDE: 98 mmol/L — AB (ref 101–111)
CO2: 23 mmol/L (ref 22–32)
Calcium: 8.3 mg/dL — ABNORMAL LOW (ref 8.9–10.3)
Creatinine, Ser: 1.26 mg/dL — ABNORMAL HIGH (ref 0.61–1.24)
GFR calc Af Amer: >60 mL/min (ref >60)
GFR, EST NON AFRICAN AMERICAN: 53 mL/min — AB (ref >60)
Glucose, Bld: 132 mg/dL — ABNORMAL HIGH (ref 65–99)
POTASSIUM: 5.4 mmol/L — AB (ref 3.5–5.1)
SODIUM: 127 mmol/L — AB (ref 135–145)

## 2016-11-14 LAB — BODY FLUID CELL COUNT WITH DIFFERENTIAL
Lymphs, Fluid: 4 %
Monocyte-Macrophage-Serous Fluid: 8 % — ABNORMAL LOW (ref 50–90)
Neutrophil Count, Fluid: 88 % — ABNORMAL HIGH (ref 0–25)
WBC FLUID: 6441 uL — AB (ref 0–1000)

## 2016-11-14 LAB — PROTEIN, PLEURAL OR PERITONEAL FLUID: Total protein, fluid: <3 g/dL

## 2016-11-14 LAB — TROPONIN I: Troponin I: 0.06 ng/mL (ref <0.03)

## 2016-11-14 LAB — STREP PNEUMONIAE URINARY ANTIGEN: STREP PNEUMO URINARY ANTIGEN: NEGATIVE

## 2016-11-14 LAB — EXPECTORATED SPUTUM ASSESSMENT W GRAM STAIN, RFLX TO RESP C

## 2016-11-14 LAB — GRAM STAIN

## 2016-11-14 LAB — MRSA PCR SCREENING: MRSA BY PCR: POSITIVE — AB

## 2016-11-14 LAB — URINE CULTURE: SPECIAL REQUESTS: NORMAL

## 2016-11-14 LAB — PROCALCITONIN: Procalcitonin: 0.88 ng/mL

## 2016-11-14 LAB — SODIUM, URINE, RANDOM: Sodium, Ur: 30 mmol/L

## 2016-11-14 LAB — OSMOLALITY, URINE: Osmolality, Ur: 439 mOsm/kg (ref 300–900)

## 2016-11-14 LAB — EXPECTORATED SPUTUM ASSESSMENT W REFEX TO RESP CULTURE

## 2016-11-14 MED ORDER — NICOTINE 21 MG/24HR TD PT24
21.0000 mg | MEDICATED_PATCH | Freq: Every day | TRANSDERMAL | Status: DC
  Administered 2016-11-14 – 2016-11-19 (×7): 21 mg via TRANSDERMAL
  Filled 2016-11-14 (×7): qty 1

## 2016-11-14 MED ORDER — NYSTATIN 100000 UNIT/ML MT SUSP
5.0000 mL | Freq: Four times a day (QID) | OROMUCOSAL | Status: DC
  Administered 2016-11-14 – 2016-11-19 (×16): 500000 [IU] via ORAL
  Filled 2016-11-14 (×19): qty 5

## 2016-11-14 MED ORDER — LIDOCAINE HCL (PF) 1 % IJ SOLN
INTRAMUSCULAR | Status: AC
  Administered 2016-11-14: 10:00:00
  Filled 2016-11-14: qty 30

## 2016-11-14 MED ORDER — VANCOMYCIN HCL IN DEXTROSE 750-5 MG/150ML-% IV SOLN
750.0000 mg | Freq: Two times a day (BID) | INTRAVENOUS | Status: DC
  Administered 2016-11-14 – 2016-11-16 (×3): 750 mg via INTRAVENOUS
  Filled 2016-11-14 (×4): qty 150

## 2016-11-14 MED ORDER — ACETAMINOPHEN 500 MG PO TABS
500.0000 mg | ORAL_TABLET | Freq: Every evening | ORAL | Status: DC | PRN
  Administered 2016-11-14: 1000 mg via ORAL
  Filled 2016-11-14: qty 2

## 2016-11-14 MED ORDER — CHLORHEXIDINE GLUCONATE CLOTH 2 % EX PADS
6.0000 | MEDICATED_PAD | Freq: Every day | CUTANEOUS | Status: AC
  Administered 2016-11-14 – 2016-11-18 (×5): 6 via TOPICAL

## 2016-11-14 MED ORDER — DIPHENHYDRAMINE HCL 25 MG PO CAPS
25.0000 mg | ORAL_CAPSULE | Freq: Every evening | ORAL | Status: DC | PRN
  Administered 2016-11-14: 25 mg via ORAL
  Filled 2016-11-14: qty 1

## 2016-11-14 MED ORDER — MUPIROCIN 2 % EX OINT
1.0000 "application " | TOPICAL_OINTMENT | Freq: Two times a day (BID) | CUTANEOUS | Status: AC
  Administered 2016-11-14 – 2016-11-18 (×10): 1 via NASAL
  Filled 2016-11-14 (×3): qty 22

## 2016-11-14 NOTE — Evaluation (Signed)
Physical Therapy Evaluation Patient Details Name: Jeremy Ditullio MRN: [id number] DOB: [birth year] Today's Date: 11/14/2016   History of Present Illness  76 y.o. male with a past medical history significant for CAD s/p CABG 2004, COPD not on home O2, HTN and Afib declined AC who presents with 1 week progressive cough, weakness, dyspnea, orthopnea.;7/8 s/p thoracentesis removed 1.8L    Clinical Impression  Pt admitted with above diagnosis. Pt currently with functional limitations due to the deficits listed below (see PT Problem List). Patient on 6L with sitting SaO2 95%, upon standing decreases to 88%, and with marching x 10 seconds decr to 84%. Pt reports he had home O2 in [facility] prior to move to [facility] in December 2017. Pt will benefit from skilled PT to increase their independence and safety with mobility to allow discharge to the venue listed below.       Follow Up Recommendations Home health PT;Supervision - Intermittent (depending on progress, may need ST-SNF)    Equipment Recommendations  Other (comment);Rolling walker with 5" wheels (continue to assess O2 needs)    Recommendations for Other Services OT consult     Precautions / Restrictions Precautions Precautions: Fall Precaution Comments: denies h/o falls; endorses recent imbalance due to illness      Mobility  Bed Mobility Overal bed mobility: Modified Independent             General bed mobility comments: sit to supine  Transfers Overall transfer level: Needs assistance Equipment used: None Transfers: Sit to/from Stand Sit to Stand: Min guard         General transfer comment: for safety   Ambulation/Gait             General Gait Details: unable due to lines; decr SaO2 with marching  Stairs            Wheelchair Mobility    Modified Rankin (Stroke Patients Only)       Balance Overall balance assessment: Needs assistance Sitting-balance support: No upper extremity supported;Feet  supported Sitting balance-Leahy Scale: Fair Sitting balance - Comments: with LE assessment, leans posteriorly   Standing balance support: No upper extremity supported Standing balance-Leahy Scale: Poor Standing balance comment: single LOB while standing with pt abruptly returning to sit EOB                             Pertinent Vitals/Pain Pain Assessment: No/denies pain    Home Living Family/patient expects to be discharged to:: Private residence Living Arrangements: Alone Available Help at Discharge: Family;Available PRN/intermittently Type of Home: Apartment Home Access: Level entry     Home Layout: One level Home Equipment: Grab bars - tub/shower;Grab bars - toilet      Prior Function Level of Independence: Independent         Comments: still drives     Hand Dominance        Extremity/Trunk Assessment        Lower Extremity Assessment Lower Extremity Assessment: Generalized weakness (lt ankle DF 4/5, bil knee extension 4/5)    Cervical / Trunk Assessment Cervical / Trunk Assessment: Other exceptions Cervical / Trunk Exceptions: forward head  Communication   Communication: No difficulties  Cognition Arousal/Alertness: Awake/alert Behavior During Therapy: WFL for tasks assessed/performed Overall Cognitive Status: Within Functional Limits for tasks assessed  General Comments General comments (skin integrity, edema, etc.): sisters present    Exercises     Assessment/Plan    PT Assessment Patient needs continued PT services  PT Problem List Decreased strength;Decreased activity tolerance;Decreased balance;Decreased mobility;Decreased knowledge of use of DME;Cardiopulmonary status limiting activity;Impaired sensation (bil LE neuropathy)       PT Treatment Interventions DME instruction;Gait training;Functional mobility training;Therapeutic activities;Therapeutic exercise;Balance  training;Patient/family education    PT Goals (Current goals can be found in the Care Plan section)  Acute Rehab PT Goals Patient Stated Goal: obtain home O2 (as he had in [facility] prior to move to [facility]) PT Goal Formulation: With patient Time For Goal Achievement: 11/28/16 Potential to Achieve Goals: Good    Frequency Min 3X/week   Barriers to discharge Decreased caregiver support      Co-evaluation               AM-PAC PT "6 Clicks" Daily Activity  Outcome Measure Difficulty turning over in bed (including adjusting bedclothes, sheets and blankets)?: A Little Difficulty moving from lying on back to sitting on the side of the bed? : A Little Difficulty sitting down on and standing up from a chair with arms (e.g., wheelchair, bedside commode, etc,.)?: A Little Help needed moving to and from a bed to chair (including a wheelchair)?: A Little Help needed walking in hospital room?: A Lot Help needed climbing 3-5 steps with a railing? : A Lot 6 Click Score: 16    End of Session Equipment Utilized During Treatment: Oxygen Activity Tolerance: Treatment limited secondary to medical complications (Comment) (SaO2 decr to 88% on 6L) Patient left: in bed;with call bell/phone within reach;with family/visitor present (per echo tech request, returned to supine)   PT Visit Diagnosis: Unsteadiness on feet (R26.81);Muscle weakness (generalized) (M62.81)    Time: 5183-3582 PT Time Calculation (min) (ACUTE ONLY): 29 min   Charges:   PT Evaluation $PT Eval Moderate Complexity: 1 Procedure     PT G Codes:          Jolisa Intriago P Thurston Brendlinger. PT 11/14/2016, 1:56 PM

## 2016-11-14 NOTE — Procedures (Signed)
Ultrasound-guided diagnostic and therapeutic right thoracentesis performed yielding 1.8 liters of turbid, yellow fluid. No immediate complications. Follow-up chest x-ray pending. A portion of the fluid was sent to the lab for preordered studies. Due to pt coughing and this being initial thoracentesis, only the above amount of fluid was removed today.

## 2016-11-14 NOTE — Progress Notes (Signed)
CRITICAL VALUE ALERT  Critical Value:  MRSA positive for nares  Date & Time Notied:  11/14/2016  Provider Notified: Dr.Danford  Orders Received/Actions taken: MRSA positive protocol intiated

## 2016-11-14 NOTE — Progress Notes (Signed)
  Echocardiogram 2D Echocardiogram has been performed.  Mike Becker 11/14/2016, 3:09 PM

## 2016-11-14 NOTE — Progress Notes (Addendum)
[name] TEAM 1 - Stepdown/ICU TEAM  [name]  DZH:[id number] DOB: [birth year] DOA: 11/13/2016 PCP: Mike Becker, No Pcp Per    Brief Narrative:  76 y.o. male with a history of CAD s/p CABG 2004, COPD, HTN, and Afib (declined AC) who presented with 1 week of progressive cough, weakness, leg swelling, dyspnea, and orthopnea.  Subjective: Mike Becker is sitting up side of the bed.  He reports he feels significantly better but not yet back to his baseline.  He denies chest pain nausea vomiting or abdominal pain.  He denies recent weight loss.  The Mike Becker tells me he is established at the [facility] in [place] and that he is followed there by Dr. Gwenette Greet.  He reports to me that he was told there was a "spot" on his lungs around December 2017.  He also admits that Dr. Gwenette Greet attempted to admit him to the hospital 2-3 weeks ago but that he left the ER AMA.  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 8.8x8.1x6.5cm per CT - worrisome for bronchogenic CA - mediastinal and suparclavicular LAD - scattered L pulm nodules c/w mets - await results of pleural fluid cytology for confirmation - if this is negative will need definitive bx   Peripheral edema Likely due to low albumin and venous insuff - no evidence of signif CHF on TTE  Mild throactic aortic aneurysm  4.1cm max AP dimension  Elevated troponin Only very mildly elevated - likely related to RVR - follow clinically - no substernal chest pain  Recent Labs Lab 11/13/16 1828 11/13/16 2258 11/14/16 0552  TROPONINI 0.07* 0.04* 0.06*    Acute kidney injury Improving w/ volume expansion - follow   Recent Labs Lab 11/13/16 1714 11/14/16 0552  CREATININE 1.46* 1.26*    Chronic atrial fibrillation with acute RVR Chadsvasc is 4 -  has previously refused anticoagulation - heart rate now well controlled - will not push anticoag at this time as bx may be required   Possible Cirrhosis  Noted on CT chest - check viral hep panel and coags   Hyperkalemia  Hyponatremia Volume depletion v/s SIADH - follow w/ volume expansion   COPD Appears severe on CT chest - followed by Pulm at [facility]  HTN  MRSA screen positive  DVT prophylaxis: lovenox  Code Status: DO NOT INTUBATE Family Communication: no family present at time of exam  Disposition Plan: SDU  Consultants:  None  Procedures: 7/8 ultrasound-guided R thoracentesis - 1.8L turbid yellow fluid 7/8 - TTE EF60-65% - no WMA - mild LVH  Antimicrobials:  Ceftriaxone 7/7 > Azithromycin >  Objective: Blood pressure 117/70, pulse 81, temperature 98.2 F (36.8 C), temperature source Oral, resp. rate (!) 23, height 6\' 3"  (1.905 m), weight 81.4 kg (179 lb 6.4 oz), SpO2 96 %.  Intake/Output Summary (Last 24 hours) at 11/14/16 1642 Last data filed at 11/14/16 1600  Gross per 24 hour  Intake          1536.67 ml  Output             1775 ml  Net          -238.33 ml   Filed Weights   11/13/16 1645 11/13/16 2257 11/14/16 0459  Weight: 80.7 kg (178 lb) 81.1 kg (178 lb 12.8 oz) 81.4 kg (179  lb 6.4 oz)    Examination: General: No acute respiratory distress Lungs: poor air movement t/o R lung fields - no wheezing  Cardiovascular: Regular rate and rhythm without murmur  Abdomen: Nontender, nondistended, soft, bowel sounds positive, no rebound, no ascites, no appreciable mass Extremities: 2+ B pedal edema   CBC:  Recent Labs Lab 11/13/16 1714 11/14/16 0552  WBC 17.5* 13.7*  NEUTROABS 16.0*  --   HGB 15.6 13.9  HCT 48.2 43.7  MCV 82.1 82.6  PLT 286 161   Basic Metabolic Panel:  Recent Labs Lab 11/13/16 1714 11/13/16 1828 11/13/16 2258 11/14/16 0552  NA 125*  --   --  127*  K 5.9* 5.9*  --  5.4*  CL 94*  --   --  98*  CO2 22  --   --  23  GLUCOSE 117*   --   --  132*  BUN 53*  --   --  53*  CREATININE 1.46*  --   --  1.26*  CALCIUM 8.8*  --   --  8.3*  MG  --   --  2.1  --    GFR: Estimated Creatinine Clearance: 56.5 mL/min (A) (by C-G formula based on SCr of 1.26 mg/dL (H)).  Liver Function Tests:  Recent Labs Lab 11/13/16 1714  AST 37  ALT 25  ALKPHOS 186*  BILITOT 1.0  PROT 6.9  ALBUMIN 2.1*    Coagulation Profile:  Recent Labs Lab 11/13/16 1714  INR 1.14    Cardiac Enzymes:  Recent Labs Lab 11/13/16 1828 11/13/16 2258 11/14/16 0552  TROPONINI 0.07* 0.04* 0.06*    Recent Results (from the past 240 hour(s))  Culture, blood (Routine x 2)     Status: None (Preliminary result)   Collection Time: 11/13/16  5:10 PM  Result Value Ref Range Status   Specimen Description BLOOD RIGHT ANTECUBITAL  Final   Special Requests   Final    BOTTLES DRAWN AEROBIC AND ANAEROBIC Blood Culture adequate volume   Culture NO GROWTH < 24 HOURS  Final   Report Status PENDING  Incomplete  Culture, blood (Routine x 2)     Status: None (Preliminary result)   Collection Time: 11/13/16  5:25 PM  Result Value Ref Range Status   Specimen Description BLOOD LEFT ANTECUBITAL  Final   Special Requests   Final    BOTTLES DRAWN AEROBIC AND ANAEROBIC Blood Culture adequate volume   Culture NO GROWTH < 24 HOURS  Final   Report Status PENDING  Incomplete  Urine culture     Status: Abnormal   Collection Time: 11/13/16  5:34 PM  Result Value Ref Range Status   Specimen Description URINE, RANDOM  Final   Special Requests Normal  Final   Culture <10,000 COLONIES/mL INSIGNIFICANT GROWTH (A)  Final   Report Status 11/14/2016 FINAL  Final  MRSA PCR Screening     Status: Abnormal   Collection Time: 11/13/16 11:00 PM  Result Value Ref Range Status   MRSA by PCR POSITIVE (A) NEGATIVE Final    Comment:        The GeneXpert MRSA Assay (FDA approved for NASAL specimens only), is one component of a comprehensive MRSA colonization surveillance  program. It is not intended to diagnose MRSA infection nor to guide or monitor treatment for MRSA infections. RESULT CALLED TO, READ BACK BY AND VERIFIED WITH: RN KARA MCKIBBEN [id number] @0233  THANEY   Culture, sputum-assessment     Status: None   Collection Time: 11/14/16  1:13 AM  Result Value Ref Range Status   Specimen Description SPUTUM  Final   Special Requests NONE  Final   Sputum evaluation   Final    Sputum specimen not acceptable for testing.  Please recollect.   Gram Stain Report Called to,Read Back By and Verified With: B RONCALO,RN AT 0740 11/14/16 BY L BENFIELD    Report Status 11/14/2016 FINAL  Final  Gram stain     Status: None   Collection Time: 11/14/16  9:50 AM  Result Value Ref Range Status   Specimen Description FLUID RIGHT PLEURAL  Final   Special Requests NONE  Final   Gram Stain   Final    ABUNDANT WBC PRESENT, PREDOMINANTLY PMN RARE GRAM POSITIVE COCCI IN CLUSTERS    Report Status 11/14/2016 FINAL  Final     Scheduled Meds: . arformoterol  15 mcg Nebulization BID  . aspirin EC  81 mg Oral Daily  . Chlorhexidine Gluconate Cloth  6 each Topical Q0600  . gabapentin  600 mg Oral TID  . lidocaine (PF)      . metoprolol tartrate  12.5 mg Oral BID  . mupirocin ointment  1 application Nasal BID  . nicotine  21 mg Transdermal Daily  . predniSONE  40 mg Oral Q breakfast  . tamsulosin  0.4 mg Oral BID  . tiotropium  18 mcg Inhalation Daily     LOS: 1 day   Cherene Altes, MD [facility] Office  [phone] Pager - Text Page per Amion as per below:  On-Call/Text Page:      [email or web address]      password [email or web address]  If 7PM-7AM, please contact night-coverage [email or web address] Password [facility] 11/14/2016, 4:42 PM

## 2016-11-14 NOTE — Progress Notes (Signed)
Pharmacy Antibiotic Note  Mike Becker is a 76 y.o. male admitted on 11/13/2016 with pneumonia.  Pharmacy has been consulted for Vancomycin dosing.  Continues on Rocephin / Zithromax  Plan: Vancomycin 750 mg iv Q 12 hours Follow up progress, Scr, cultures  Height: 6\' 3"  (190.5 cm) Weight: 179 lb 6.4 oz (81.4 kg) IBW/kg (Calculated) : 84.5  Temp (24hrs), Avg:97.9 F (36.6 C), Min:97.6 F (36.4 C), Max:98.2 F (36.8 C)   Recent Labs Lab 11/13/16 1714 11/13/16 1729 11/13/16 1944 11/14/16 0552  WBC 17.5*  --   --  13.7*  CREATININE 1.46*  --   --  1.26*  LATICACIDVEN  --  2.10* 1.86  --     Estimated Creatinine Clearance: 56.5 mL/min (A) (by C-G formula based on SCr of 1.26 mg/dL (H)).    No Known Allergies  Thank you for allowing pharmacy to be a part of this patient's care. Anette Guarneri, PharmD [phone] 11/14/2016 5:49 PM

## 2016-11-15 DIAGNOSIS — J90 Pleural effusion, not elsewhere classified: Secondary | ICD-10-CM

## 2016-11-15 DIAGNOSIS — B9562 Methicillin resistant Staphylococcus aureus infection as the cause of diseases classified elsewhere: Secondary | ICD-10-CM | POA: Diagnosis present

## 2016-11-15 DIAGNOSIS — R7881 Bacteremia: Secondary | ICD-10-CM | POA: Diagnosis present

## 2016-11-15 DIAGNOSIS — J15212 Pneumonia due to Methicillin resistant Staphylococcus aureus: Secondary | ICD-10-CM

## 2016-11-15 LAB — COMPREHENSIVE METABOLIC PANEL
ALBUMIN: 1.6 g/dL — AB (ref 3.5–5.0)
ALK PHOS: 139 U/L — AB (ref 38–126)
ALT: 23 U/L (ref 17–63)
ANION GAP: 5 (ref 5–15)
AST: 31 U/L (ref 15–41)
BILIRUBIN TOTAL: 0.5 mg/dL (ref 0.3–1.2)
BUN: 49 mg/dL — AB (ref 6–20)
CALCIUM: 8.2 mg/dL — AB (ref 8.9–10.3)
CO2: 25 mmol/L (ref 22–32)
Chloride: 99 mmol/L — ABNORMAL LOW (ref 101–111)
Creatinine, Ser: 1.08 mg/dL (ref 0.61–1.24)
GFR calc Af Amer: >60 mL/min (ref >60)
GLUCOSE: 115 mg/dL — AB (ref 65–99)
POTASSIUM: 5.4 mmol/L — AB (ref 3.5–5.1)
Sodium: 129 mmol/L — ABNORMAL LOW (ref 135–145)
TOTAL PROTEIN: 5.6 g/dL — AB (ref 6.5–8.1)

## 2016-11-15 LAB — BLOOD CULTURE ID PANEL (REFLEXED)
Acinetobacter baumannii: NOT DETECTED
CANDIDA ALBICANS: NOT DETECTED
CANDIDA GLABRATA: NOT DETECTED
CANDIDA KRUSEI: NOT DETECTED
CANDIDA PARAPSILOSIS: NOT DETECTED
CANDIDA TROPICALIS: NOT DETECTED
ENTEROBACTER CLOACAE COMPLEX: NOT DETECTED
ENTEROBACTERIACEAE SPECIES: NOT DETECTED
Enterococcus species: NOT DETECTED
Escherichia coli: NOT DETECTED
Haemophilus influenzae: NOT DETECTED
KLEBSIELLA OXYTOCA: NOT DETECTED
KLEBSIELLA PNEUMONIAE: NOT DETECTED
Listeria monocytogenes: NOT DETECTED
Methicillin resistance: DETECTED — AB
Neisseria meningitidis: NOT DETECTED
PROTEUS SPECIES: NOT DETECTED
Pseudomonas aeruginosa: NOT DETECTED
STAPHYLOCOCCUS SPECIES: DETECTED — AB
STREPTOCOCCUS PYOGENES: NOT DETECTED
STREPTOCOCCUS SPECIES: NOT DETECTED
Serratia marcescens: NOT DETECTED
Staphylococcus aureus (BCID): DETECTED — AB
Streptococcus agalactiae: NOT DETECTED
Streptococcus pneumoniae: NOT DETECTED

## 2016-11-15 LAB — CBC
HEMATOCRIT: 44 % (ref 39.0–52.0)
HEMOGLOBIN: 13.6 g/dL (ref 13.0–17.0)
MCH: 26 pg (ref 26.0–34.0)
MCHC: 30.9 g/dL (ref 30.0–36.0)
MCV: 84 fL (ref 78.0–100.0)
Platelets: 238 10*3/uL (ref 150–400)
RBC: 5.24 MIL/uL (ref 4.22–5.81)
RDW: 15.5 % (ref 11.5–15.5)
WBC: 11.2 10*3/uL — AB (ref 4.0–10.5)

## 2016-11-15 LAB — HEPATITIS PANEL, ACUTE
HCV Ab: <0.1 s/co ratio (ref 0.0–0.9)
HEP B S AG: NEGATIVE
Hep A IgM: NEGATIVE
Hep B C IgM: NEGATIVE

## 2016-11-15 MED ORDER — HEPARIN SODIUM (PORCINE) 5000 UNIT/ML IJ SOLN
5000.0000 [IU] | Freq: Three times a day (TID) | INTRAMUSCULAR | Status: DC
  Administered 2016-11-15 – 2016-11-16 (×4): 5000 [IU] via SUBCUTANEOUS
  Filled 2016-11-15 (×4): qty 1

## 2016-11-15 NOTE — Consult Note (Signed)
[place] for Infectious Disease    Date of Admission:  11/13/2016   Total days of antibiotics 2         Day 2 of azithromycin         Day 2 of ceftriaxone         Day 2 of vancomycin       Reason for Consult: MRSA bacteremia    Referring Provider: Cheri Guppy MD. Primary Care Provider: Thayer Dallas  Assessment: MRSA bacteremia secondary to pneumonia. [name] developed MRSA bacteremia most likely secondary to pneumonia as his thoracentesis fluid Gram stain was positive with gram-positive cocci in clusters, cytology results are pending. His CT chest is concerning for bronchogenic carcinoma most likely responsible for obstructive pneumonia. He will need a pulmonary and cardiothoracic surgery consult if he re accumulate pleural effusion for a possible drainage placement and biopsy for definitive diagnosis. He did had a TTE which was negative, he will need TEE to rule out endocarditis as it will affect the duration of antibiotic use.  Plan: 1. Repeat blood culture and chest x-ray tomorrow morning. 2. Discontinue azithromycin and ceftriaxone. 3. Continue vancomycin-duration will be determined after ruling out endocarditis and cardiothoracic surgery consult. 4. TEE to rule out endocarditis. 5. Cardiothoracic surgery consult to place a drainage tube.  Principal Problem:   MRSA bacteremia Active Problems:   Community acquired pneumonia of right lower lobe of lung ([place])   Sepsis ([place])   Pleural effusion, right   Acute on chronic diastolic CHF (congestive heart failure) ([name])   Elevated troponin   AKI (acute kidney injury) ([place])   Hyponatremia   Hyperkalemia   Essential hypertension   AF (paroxysmal atrial fibrillation) ([name])   Coronary artery disease due to lipid rich plaque   COPD (chronic obstructive pulmonary disease) ([place])   . arformoterol  15 mcg Nebulization BID  . aspirin EC  81 mg Oral Daily  . Chlorhexidine Gluconate Cloth  6 each Topical  Q0600  . gabapentin  600 mg Oral TID  . heparin subcutaneous  5,000 Units Subcutaneous Q8H  . metoprolol tartrate  12.5 mg Oral BID  . mupirocin ointment  1 application Nasal BID  . nicotine  21 mg Transdermal Daily  . nystatin  5 mL Oral QID  . tamsulosin  0.4 mg Oral BID  . tiotropium  18 mcg Inhalation Daily    HPI: Mike Becker is a 76 y.o. male with PMHx significant for  CAD s/p CABG 2004, COPD on home O2(2-3L) which he uses inconsistently,HTN, and Afib (declined AC)was admitted with complaint of worsening cough, shortness of breath and bilateral leg swelling for couple of weeks. He was found to have large right lung mass with parapneumonic pleural effusion and bilateral opacities and multiple enlarged lymph nodes most likely a bronchogenic malignancy causing postobstructive pneumonia, had diagnostic thoracentesis with 1.8 L of drainage which was growing gram-positive cocci in clusters on Gram stain, blood culture was positive for MRSA in aerobic bottles.  Patient is a current smoker with advanced COPD, was placed on home oxygen 3 L during the day and 2 L at night which he was using inconsistently as he does not have a portable tank and dragging the big tank was a problem for him.  ID was consulted for MRSA bacteremia.  Review of Systems: ROS  Past Medical History:  Diagnosis Date  . A-fib ([place])   . COPD (chronic obstructive pulmonary disease) ([place])   .  Dyspnea   . Dysrhythmia   . Hypertension   . MI (myocardial infarction) [facility])     Social History  Substance Use Topics  . Smoking status: Current Every Day Smoker    Packs/day: 1.00    Years: 50.00    Types: Cigarettes  . Smokeless tobacco: Never Used  . Alcohol use No    Family History  Problem Relation Age of Onset  . Diabetes Mother   . Congestive Heart Failure Mother   . Congenital heart disease Mother   . Diabetes Brother    No Known Allergies  OBJECTIVE: Blood pressure 115/69, pulse 81, temperature  98 F (36.7 C), temperature source Oral, resp. rate (!) 27, height 6\' 3"  (1.905 m), weight 175 lb 12.8 oz (79.7 kg), SpO2 93 %.  Physical Exam   Vitals:   11/15/16 1100 11/15/16 1200 11/15/16 1300 11/15/16 1400  BP:  115/69    Pulse: 86 81 81 81  Resp: (!) 24 (!) 25 (!) 23 (!) 27  Temp:  98 F (36.7 C)    TempSrc:  Oral    SpO2: 92% 94% 95% 93%  Weight:      Height:       General: Vital signs reviewed.  Patient is well-developed and well-nourished,Sitting comfortably in the chair with nasal cannula, in no acute distress and cooperative with exam.  Cardiovascular: RRR, S1 normal, S2 normal, no murmurs, gallops, or rubs. Pulmonary/Chest: Decreased breath sounds at bases bilaterally and right mid zone. Extremities: 2+ lower extremity edema bilaterally,  pulses symmetric and intact bilaterally.   Lab Results Lab Results  Component Value Date   WBC 11.2 (H) 11/15/2016   HGB 13.6 11/15/2016   HCT 44.0 11/15/2016   MCV 84.0 11/15/2016   PLT 238 11/15/2016    Lab Results  Component Value Date   CREATININE 1.08 11/15/2016   BUN 49 (H) 11/15/2016   NA 129 (L) 11/15/2016   K 5.4 (H) 11/15/2016   CL 99 (L) 11/15/2016   CO2 25 11/15/2016    Lab Results  Component Value Date   ALT 23 11/15/2016   AST 31 11/15/2016   ALKPHOS 139 (H) 11/15/2016   BILITOT 0.5 11/15/2016     Microbiology: Recent Results (from the past 240 hour(s))  Culture, blood (Routine x 2)     Status: None (Preliminary result)   Collection Time: 11/13/16  5:10 PM  Result Value Ref Range Status   Specimen Description BLOOD RIGHT ANTECUBITAL  Final   Special Requests   Final    BOTTLES DRAWN AEROBIC AND ANAEROBIC Blood Culture adequate volume   Culture NO GROWTH 2 DAYS  Final   Report Status PENDING  Incomplete  Culture, blood (Routine x 2)     Status: None (Preliminary result)   Collection Time: 11/13/16  5:25 PM  Result Value Ref Range Status   Specimen Description BLOOD LEFT ANTECUBITAL  Final    Special Requests   Final    BOTTLES DRAWN AEROBIC AND ANAEROBIC Blood Culture adequate volume   Culture  Setup Time   Final    GRAM POSITIVE COCCI IN CLUSTERS ANAEROBIC BOTTLE ONLY CRITICAL RESULT CALLED TO, READ BACK BY AND VERIFIED WITH: [name],PHARMD 11/15/16 1455 [name]    Culture GRAM POSITIVE COCCI  Final   Report Status PENDING  Incomplete  Blood Culture ID Panel (Reflexed)     Status: Abnormal   Collection Time: 11/13/16  5:25 PM  Result Value Ref Range Status   Enterococcus species  NOT DETECTED NOT DETECTED Final   Listeria monocytogenes NOT DETECTED NOT DETECTED Final   Staphylococcus species DETECTED (A) NOT DETECTED Final    Comment: CRITICAL RESULT CALLED TO, READ BACK BY AND VERIFIED WITH: [name], PHARMD 11/15/16 1455 [name]    Staphylococcus aureus DETECTED (A) NOT DETECTED Final    Comment: Methicillin (oxacillin)-resistant Staphylococcus aureus (MRSA). MRSA is predictably resistant to beta-lactam antibiotics (except ceftaroline). Preferred therapy is vancomycin unless clinically contraindicated. Patient requires contact precautions if  hospitalized. CRITICAL RESULT CALLED TO, READ BACK BY AND VERIFIED WITH: [name], PHARMD 11/15/16 [name]    Methicillin resistance DETECTED (A) NOT DETECTED Final    Comment: CRITICAL RESULT CALLED TO, READ BACK BY AND VERIFIED WITH: [name],PHARMD 11/15/16 1455 [name]    Streptococcus species NOT DETECTED NOT DETECTED Final   Streptococcus agalactiae NOT DETECTED NOT DETECTED Final   Streptococcus pneumoniae NOT DETECTED NOT DETECTED Final   Streptococcus pyogenes NOT DETECTED NOT DETECTED Final   Acinetobacter baumannii NOT DETECTED NOT DETECTED Final   Enterobacteriaceae species NOT DETECTED NOT DETECTED Final   Enterobacter cloacae complex NOT DETECTED NOT DETECTED Final   Escherichia coli NOT DETECTED NOT DETECTED Final   Klebsiella oxytoca NOT DETECTED NOT DETECTED Final   Klebsiella pneumoniae NOT DETECTED NOT  DETECTED Final   Proteus species NOT DETECTED NOT DETECTED Final   Serratia marcescens NOT DETECTED NOT DETECTED Final   Haemophilus influenzae NOT DETECTED NOT DETECTED Final   Neisseria meningitidis NOT DETECTED NOT DETECTED Final   Pseudomonas aeruginosa NOT DETECTED NOT DETECTED Final   Candida albicans NOT DETECTED NOT DETECTED Final   Candida glabrata NOT DETECTED NOT DETECTED Final   Candida krusei NOT DETECTED NOT DETECTED Final   Candida parapsilosis NOT DETECTED NOT DETECTED Final   Candida tropicalis NOT DETECTED NOT DETECTED Final  Urine culture     Status: Abnormal   Collection Time: 11/13/16  5:34 PM  Result Value Ref Range Status   Specimen Description URINE, RANDOM  Final   Special Requests Normal  Final   Culture <10,000 COLONIES/mL INSIGNIFICANT GROWTH (A)  Final   Report Status 11/14/2016 FINAL  Final  MRSA PCR Screening     Status: Abnormal   Collection Time: 11/13/16 11:00 PM  Result Value Ref Range Status   MRSA by PCR POSITIVE (A) NEGATIVE Final    Comment:        The GeneXpert MRSA Assay (FDA approved for NASAL specimens only), is one component of a comprehensive MRSA colonization surveillance program. It is not intended to diagnose MRSA infection nor to guide or monitor treatment for MRSA infections. RESULT CALLED TO, READ BACK BY AND VERIFIED WITH: RN KARA MCKIBBEN [id number] @0233  THANEY   Culture, sputum-assessment     Status: None   Collection Time: 11/14/16  1:13 AM  Result Value Ref Range Status   Specimen Description SPUTUM  Final   Special Requests NONE  Final   Sputum evaluation   Final    Sputum specimen not acceptable for testing.  Please recollect.   Gram Stain Report Called to,Read Back By and Verified With: B RONCALO,RN AT 0740 11/14/16 BY L BENFIELD    Report Status 11/14/2016 FINAL  Final  Culture, body fluid-bottle     Status: None (Preliminary result)   Collection Time: 11/14/16  9:50 AM  Result Value Ref Range Status   Specimen  Description FLUID RIGHT PLEURAL  Final   Special Requests NONE  Final   Culture NO GROWTH  1 DAY  Final   Report Status PENDING  Incomplete  Gram stain     Status: None   Collection Time: 11/14/16  9:50 AM  Result Value Ref Range Status   Specimen Description FLUID RIGHT PLEURAL  Final   Special Requests NONE  Final   Gram Stain   Final    ABUNDANT WBC PRESENT, PREDOMINANTLY PMN RARE GRAM POSITIVE COCCI IN CLUSTERS    Report Status 11/14/2016 FINAL  Final    Lorella Nimrod, MD [facility] [phone] pager   [phone] cell 11/15/2016, 4:38 PM

## 2016-11-15 NOTE — Progress Notes (Addendum)
   11/15/16 1037  Clinical Encounter Type  Visited With Patient  Visit Type Initial;Other (Comment) (Advanced Directive Education)   Chaplain left Advanced Directive paperwork in patient's room.  Patient was sound asleep and didn't wake up when name called.  Paperwork left for him.  No family in room with patient.    Chaplain walked back by patient's room and Patient's brother and sister-in-law arrived.  Chaplain went back in and found the patient awake.  Chaplain provided AD education for the patient.  Patient was well aware of what the AD is and said he doesn't want it at this time.  Patient's family advised that another sister was coming a little later and that it would be good if he completed it.  Patient said he would call if he wanted to complete it while in house.

## 2016-11-15 NOTE — Care Management Note (Signed)
Case Management Note  Patient Details  Name: Mike Becker MRN: [id number] Date of Birth: 1940-06-10  Subjective/Objective:   Pt admitted with SOB, edema, weakness - pt recently noted to have pulm nodule thought to be Lung CA                Action/Plan:   PTA from home.  Pt is established with the [facility].  CM left voicemail for [employer] at [facility] to inform of admit.  Pt is currently requiring HFNC and may need HH or SNF at discharge - CM will continue to follow for discharge needs   Expected Discharge Date:                  Expected Discharge Plan:     In-House Referral:     Discharge planning Services  CM Consult  Post Acute Care Choice:    Choice offered to:     DME Arranged:    DME Agency:     HH Arranged:    [place] Agency:     Status of Service:     If discussed at [employer], dates discussed:    Additional Comments:  [name], RN 11/15/2016, 10:36 AM

## 2016-11-15 NOTE — Progress Notes (Signed)
[place] TEAM 1 - Stepdown/ICU TEAM  Mike Becker  HYW:[id number] DOB: [birth year] DOA: 11/13/2016 PCP: Patient, No Pcp Per    Brief Narrative:  76 y.o. male with a history of CAD s/p CABG 2004, COPD, HTN, and Afib (declined AC) who presented with 1 week of progressive cough, weakness, leg swelling, dyspnea, and orthopnea.  Subjective: The patient reports that he feels markedly better.  He definitely looks better overall.  He denies current shortness breath fevers chills nausea or vomiting.  He denies abdominal pain.  With the patient's permission I spoke with Dr. Danton Sewer at the [facility] in [place].  He tells me the only imaging they have on the patient is a CT of the abdomen in March 2018 which did reveal a 7 mm lung nodule at the right base.  Given the size of this lesion the plan was to simply monitor it with repeat imaging.  Imaging to include the hilum however has not been accomplished at the [place].  Dr. Hinda Kehr further advise me that the patient doesn't fact have severe COPD with an FEV1/FVC of 32% predicted.  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 Gwenette Greet has informed me that he could perform this as an outpatient at the [facility] in [place] if the patient is otherwise stable  Peripheral edema Likely due to low albumin and venous insuff - no evidence of signif CHF on TTE - elevated feet -  TED hose as able   Mild throactic aortic aneurysm  4.1cm max AP dimension  Elevated troponin Only very mildly elevated - likely related to RVR - follow clinically - no substernal chest pain  Recent Labs Lab 11/13/16 1828 11/13/16 2258 11/14/16 0552  TROPONINI 0.07* 0.04* 0.06*    Acute kidney injury Nearly resolved w/ volume expansion - follow while on Vanc   Recent Labs Lab 11/13/16 1714 11/14/16 0552 11/15/16 0304  CREATININE 1.46* 1.26* 1.08    Chronic atrial fibrillation with acute RVR Chadsvasc is 4 - has previously refused anticoagulation - heart rate now well controlled - would not initiate full anticoag at this time as bx may be required   Possible Cirrhosis  Noted on CT chest - viral hep panel negative - INR normal    Hyperkalemia  Hyponatremia Volume depletion v/s SIADH - improving w/ volume expansion, arguing for the former   COPD Appears severe on CT chest - followed by Pulm at [facility] - well compensated at this time   HTN  MRSA screen positive  DVT prophylaxis: lovenox  Code Status: DO NOT INTUBATE Family Communication: Spoke with patient and sister at bedside at length Disposition Plan: Stable for transfer to telemetry bed - continue to treat significant MRSA infection - await results of pleural fluid cytology to determine if additional biopsy attempts necessary  Consultants:  None  Procedures: 7/8 ultrasound-guided R thoracentesis - 1.8L turbid yellow fluid 7/8 - TTE EF60-65% - no WMA - mild LVH  Antimicrobials:  Ceftriaxone 7/7 > 7/8 Azithromycin 7/8 Vancomycin 7/8 >  Objective: Blood pressure 115/69, pulse 81, temperature 98 F (36.7 C), temperature source Oral, resp. rate (!) 27, height 6\' 3"  (1.905 m), weight 79.7 kg (175 lb 12.8 oz), SpO2 93 %.  Intake/Output Summary (Last 24 hours) at 11/15/16 1647 Last data filed at 11/15/16 1400  Gross per 24 hour  Intake             1420 ml  Output              950 ml  Net              470 ml     Filed Weights   11/13/16 2257 11/14/16 0459 11/15/16 0400  Weight: 81.1 kg (178 lb 12.8 oz) 81.4 kg (179 lb 6.4 oz) 79.7 kg (175 lb 12.8 oz)    Examination: General: No acute respiratory distress at rest in bed  Lungs: Improved air movement throughout right with persisting right basilar crackles - good air movement on left with no wheezing Cardiovascular: Regular rate without murmur or rub Abdomen: Nontender, nondistended, soft, bowel sounds positive, no rebound, no ascites, no appreciable mass Extremities: 2+ B pedal edema persists without significant change  CBC:  Recent Labs Lab 11/13/16 1714 11/14/16 0552 11/15/16 0304  WBC 17.5* 13.7* 11.2*  NEUTROABS 16.0*  --   --   HGB 15.6 13.9 13.6  HCT 48.2 43.7 44.0  MCV 82.1 82.6 84.0  PLT 286 238 258   Basic Metabolic Panel:  Recent Labs Lab 11/13/16 1714 11/13/16 1828 11/13/16 2258 11/14/16 0552 11/15/16 0304  NA 125*  --   --  127* 129*  K 5.9* 5.9*  --  5.4* 5.4*  CL 94*  --   --  98* 99*  CO2 22  --   --  23 25  GLUCOSE 117*  --   --  132* 115*  BUN 53*  --   --  53* 49*  CREATININE 1.46*  --   --  1.26* 1.08  CALCIUM 8.8*  --   --  8.3* 8.2*  MG  --   --  2.1  --   --    GFR: Estimated Creatinine Clearance: 64.6 mL/min (by C-G formula based on SCr of 1.08 mg/dL).  Liver Function Tests:  Recent Labs Lab 11/13/16 1714 11/15/16 0304  AST 37 31  ALT 25 23  ALKPHOS 186* 139*  BILITOT 1.0 0.5  PROT 6.9 5.6*  ALBUMIN 2.1* 1.6*    Coagulation Profile:  Recent Labs Lab 11/13/16 1714 11/14/16 1841  INR 1.14 1.15    Cardiac Enzymes:  Recent Labs Lab 11/13/16 1828 11/13/16 2258 11/14/16 0552  TROPONINI 0.07* 0.04* 0.06*    Recent Results (from the past 240 hour(s))  Culture, blood (Routine x 2)     Status: None (Preliminary result)   Collection Time: 11/13/16  5:10 PM  Result Value Ref Range Status   Specimen Description BLOOD RIGHT ANTECUBITAL  Final   Special Requests   Final     BOTTLES DRAWN AEROBIC AND ANAEROBIC Blood Culture adequate volume   Culture NO GROWTH 2 DAYS  Final   Report Status PENDING  Incomplete  Culture, blood (Routine x 2)     Status: None (Preliminary result)   Collection Time: 11/13/16  5:25 PM  Result Value Ref Range Status   Specimen Description BLOOD LEFT ANTECUBITAL  Final   Special Requests   Final    BOTTLES  DRAWN AEROBIC AND ANAEROBIC Blood Culture adequate volume   Culture  Setup Time   Final    GRAM POSITIVE COCCI IN CLUSTERS ANAEROBIC BOTTLE ONLY CRITICAL RESULT CALLED TO, READ BACK BY AND VERIFIED WITH: [name],PHARMD 11/15/16 1455 [name]    Culture GRAM POSITIVE COCCI  Final   Report Status PENDING  Incomplete  Blood Culture ID Panel (Reflexed)     Status: Abnormal   Collection Time: 11/13/16  5:25 PM  Result Value Ref Range Status   Enterococcus species NOT DETECTED NOT DETECTED Final   Listeria monocytogenes NOT DETECTED NOT DETECTED Final   Staphylococcus species DETECTED (A) NOT DETECTED Final    Comment: CRITICAL RESULT CALLED TO, READ BACK BY AND VERIFIED WITH: [name], PHARMD 11/15/16 1455 [name]    Staphylococcus aureus DETECTED (A) NOT DETECTED Final    Comment: Methicillin (oxacillin)-resistant Staphylococcus aureus (MRSA). MRSA is predictably resistant to beta-lactam antibiotics (except ceftaroline). Preferred therapy is vancomycin unless clinically contraindicated. Patient requires contact precautions if  hospitalized. CRITICAL RESULT CALLED TO, READ BACK BY AND VERIFIED WITH: [name], PHARMD 11/15/16 [name]    Methicillin resistance DETECTED (A) NOT DETECTED Final    Comment: CRITICAL RESULT CALLED TO, READ BACK BY AND VERIFIED WITH: [name],PHARMD 11/15/16 1455 [name]    Streptococcus species NOT DETECTED NOT DETECTED Final   Streptococcus agalactiae NOT DETECTED NOT DETECTED Final   Streptococcus pneumoniae NOT DETECTED NOT DETECTED Final   Streptococcus pyogenes NOT DETECTED NOT DETECTED Final    Acinetobacter baumannii NOT DETECTED NOT DETECTED Final   Enterobacteriaceae species NOT DETECTED NOT DETECTED Final   Enterobacter cloacae complex NOT DETECTED NOT DETECTED Final   Escherichia coli NOT DETECTED NOT DETECTED Final   Klebsiella oxytoca NOT DETECTED NOT DETECTED Final   Klebsiella pneumoniae NOT DETECTED NOT DETECTED Final   Proteus species NOT DETECTED NOT DETECTED Final   Serratia marcescens NOT DETECTED NOT DETECTED Final   Haemophilus influenzae NOT DETECTED NOT DETECTED Final   Neisseria meningitidis NOT DETECTED NOT DETECTED Final   Pseudomonas aeruginosa NOT DETECTED NOT DETECTED Final   Candida albicans NOT DETECTED NOT DETECTED Final   Candida glabrata NOT DETECTED NOT DETECTED Final   Candida krusei NOT DETECTED NOT DETECTED Final   Candida parapsilosis NOT DETECTED NOT DETECTED Final   Candida tropicalis NOT DETECTED NOT DETECTED Final  Urine culture     Status: Abnormal   Collection Time: 11/13/16  5:34 PM  Result Value Ref Range Status   Specimen Description URINE, RANDOM  Final   Special Requests Normal  Final   Culture <10,000 COLONIES/mL INSIGNIFICANT GROWTH (A)  Final   Report Status 11/14/2016 FINAL  Final  MRSA PCR Screening     Status: Abnormal   Collection Time: 11/13/16 11:00 PM  Result Value Ref Range Status   MRSA by PCR POSITIVE (A) NEGATIVE Final    Comment:        The GeneXpert MRSA Assay (FDA approved for NASAL specimens only), is one component of a comprehensive MRSA colonization surveillance program. It is not intended to diagnose MRSA infection nor to guide or monitor treatment for MRSA infections. RESULT CALLED TO, READ BACK BY AND VERIFIED WITH: RN KARA MCKIBBEN [id number] @0233  THANEY   Culture, sputum-assessment     Status: None   Collection Time: 11/14/16  1:13 AM  Result Value Ref Range Status   Specimen Description SPUTUM  Final   Special Requests NONE  Final   Sputum evaluation  Final    Sputum specimen not acceptable  for testing.  Please recollect.   Gram Stain Report Called to,Read Back By and Verified With: B RONCALO,RN AT 0740 11/14/16 BY L BENFIELD    Report Status 11/14/2016 FINAL  Final  Culture, body fluid-bottle     Status: None (Preliminary result)   Collection Time: 11/14/16  9:50 AM  Result Value Ref Range Status   Specimen Description FLUID RIGHT PLEURAL  Final   Special Requests NONE  Final   Culture NO GROWTH 1 DAY  Final   Report Status PENDING  Incomplete  Gram stain     Status: None   Collection Time: 11/14/16  9:50 AM  Result Value Ref Range Status   Specimen Description FLUID RIGHT PLEURAL  Final   Special Requests NONE  Final   Gram Stain   Final    ABUNDANT WBC PRESENT, PREDOMINANTLY PMN RARE GRAM POSITIVE COCCI IN CLUSTERS    Report Status 11/14/2016 FINAL  Final     Scheduled Meds: . arformoterol  15 mcg Nebulization BID  . aspirin EC  81 mg Oral Daily  . Chlorhexidine Gluconate Cloth  6 each Topical Q0600  . gabapentin  600 mg Oral TID  . heparin subcutaneous  5,000 Units Subcutaneous Q8H  . metoprolol tartrate  12.5 mg Oral BID  . mupirocin ointment  1 application Nasal BID  . nicotine  21 mg Transdermal Daily  . nystatin  5 mL Oral QID  . tamsulosin  0.4 mg Oral BID  . tiotropium  18 mcg Inhalation Daily     LOS: 2 days   Cherene Altes, MD [facility] Office  [phone] Pager - Text Page per Amion as per below:  On-Call/Text Page:      [email or web address]      password [email or web address]  If 7PM-7AM, please contact night-coverage [email or web address] Password [facility] 11/15/2016, 4:47 PM

## 2016-11-15 NOTE — Progress Notes (Signed)
  PHARMACY - PHYSICIAN COMMUNICATION CRITICAL VALUE ALERT - BLOOD CULTURE IDENTIFICATION (BCID)  Results for orders placed or performed during the hospital encounter of 11/13/16  Blood Culture ID Panel (Reflexed) (Collected: 11/13/2016  5:25 PM)  Result Value Ref Range   Enterococcus species NOT DETECTED NOT DETECTED   Listeria monocytogenes NOT DETECTED NOT DETECTED   Staphylococcus species DETECTED (A) NOT DETECTED   Staphylococcus aureus DETECTED (A) NOT DETECTED   Methicillin resistance DETECTED (A) NOT DETECTED   Streptococcus species NOT DETECTED NOT DETECTED   Streptococcus agalactiae NOT DETECTED NOT DETECTED   Streptococcus pneumoniae NOT DETECTED NOT DETECTED   Streptococcus pyogenes NOT DETECTED NOT DETECTED   Acinetobacter baumannii NOT DETECTED NOT DETECTED   Enterobacteriaceae species NOT DETECTED NOT DETECTED   Enterobacter cloacae complex NOT DETECTED NOT DETECTED   Escherichia coli NOT DETECTED NOT DETECTED   Klebsiella oxytoca NOT DETECTED NOT DETECTED   Klebsiella pneumoniae NOT DETECTED NOT DETECTED   Proteus species NOT DETECTED NOT DETECTED   Serratia marcescens NOT DETECTED NOT DETECTED   Haemophilus influenzae NOT DETECTED NOT DETECTED   Neisseria meningitidis NOT DETECTED NOT DETECTED   Pseudomonas aeruginosa NOT DETECTED NOT DETECTED   Candida albicans NOT DETECTED NOT DETECTED   Candida glabrata NOT DETECTED NOT DETECTED   Candida krusei NOT DETECTED NOT DETECTED   Candida parapsilosis NOT DETECTED NOT DETECTED   Candida tropicalis NOT DETECTED NOT DETECTED    Name of physician (or Provider) Contacted: McClung (via text page)  Changes to prescribed antibiotics required: Consider Vancomycin monotherapy unless anaerobic coverage deemed necessary for lung mass. ID to be automatically consulted and to advise on antibiotics.   Lawson Radar 11/15/2016  3:17 PM

## 2016-11-16 ENCOUNTER — Inpatient Hospital Stay (HOSPITAL_COMMUNITY): Payer: Medicare PPO

## 2016-11-16 LAB — BASIC METABOLIC PANEL
ANION GAP: 7 (ref 5–15)
BUN: 34 mg/dL — AB (ref 6–20)
CO2: 25 mmol/L (ref 22–32)
Calcium: 8.3 mg/dL — ABNORMAL LOW (ref 8.9–10.3)
Chloride: 100 mmol/L — ABNORMAL LOW (ref 101–111)
Creatinine, Ser: 0.95 mg/dL (ref 0.61–1.24)
GFR calc Af Amer: >60 mL/min (ref >60)
Glucose, Bld: 102 mg/dL — ABNORMAL HIGH (ref 65–99)
POTASSIUM: 5.6 mmol/L — AB (ref 3.5–5.1)
SODIUM: 132 mmol/L — AB (ref 135–145)

## 2016-11-16 LAB — LEGIONELLA PNEUMOPHILA SEROGP 1 UR AG: L. PNEUMOPHILA SEROGP 1 UR AG: NEGATIVE

## 2016-11-16 LAB — CBC
HCT: 46.5 % (ref 39.0–52.0)
Hemoglobin: 14.8 g/dL (ref 13.0–17.0)
MCH: 26.7 pg (ref 26.0–34.0)
MCHC: 31.8 g/dL (ref 30.0–36.0)
MCV: 83.8 fL (ref 78.0–100.0)
PLATELETS: 227 10*3/uL (ref 150–400)
RBC: 5.55 MIL/uL (ref 4.22–5.81)
RDW: 15.2 % (ref 11.5–15.5)
WBC: 10.7 10*3/uL — AB (ref 4.0–10.5)

## 2016-11-16 MED ORDER — HEPARIN (PORCINE) IN NACL 100-0.45 UNIT/ML-% IJ SOLN
1650.0000 [IU]/h | INTRAMUSCULAR | Status: DC
  Administered 2016-11-16: 1300 [IU]/h via INTRAVENOUS
  Administered 2016-11-18: 1650 [IU]/h via INTRAVENOUS
  Filled 2016-11-16 (×5): qty 250

## 2016-11-16 MED ORDER — FUROSEMIDE 10 MG/ML IJ SOLN
20.0000 mg | Freq: Once | INTRAMUSCULAR | Status: AC
  Administered 2016-11-16: 20 mg via INTRAVENOUS
  Filled 2016-11-16: qty 2

## 2016-11-16 MED ORDER — METOPROLOL TARTRATE 5 MG/5ML IV SOLN
2.5000 mg | INTRAVENOUS | Status: DC | PRN
  Administered 2016-11-17: 2.5 mg via INTRAVENOUS
  Filled 2016-11-16: qty 5

## 2016-11-16 MED ORDER — DIGOXIN 0.25 MG/ML IJ SOLN
0.2500 mg | Freq: Once | INTRAMUSCULAR | Status: AC
  Administered 2016-11-16: 0.25 mg via INTRAVENOUS
  Filled 2016-11-16: qty 2

## 2016-11-16 MED ORDER — DILTIAZEM HCL 100 MG IV SOLR
5.0000 mg/h | INTRAVENOUS | Status: DC
Start: 2016-11-16 — End: 2016-11-16
  Administered 2016-11-16: 5 mg/h via INTRAVENOUS
  Filled 2016-11-16 (×2): qty 100

## 2016-11-16 MED ORDER — VANCOMYCIN HCL IN DEXTROSE 1-5 GM/200ML-% IV SOLN
1000.0000 mg | Freq: Two times a day (BID) | INTRAVENOUS | Status: DC
  Administered 2016-11-16 – 2016-11-18 (×4): 1000 mg via INTRAVENOUS
  Filled 2016-11-16 (×7): qty 200

## 2016-11-16 MED ORDER — AMIODARONE HCL IN DEXTROSE 360-4.14 MG/200ML-% IV SOLN
INTRAVENOUS | Status: AC
  Filled 2016-11-16: qty 200

## 2016-11-16 MED ORDER — HEPARIN BOLUS VIA INFUSION
1000.0000 [IU] | Freq: Once | INTRAVENOUS | Status: AC
  Administered 2016-11-16: 1000 [IU] via INTRAVENOUS
  Filled 2016-11-16: qty 1000

## 2016-11-16 MED ORDER — LEVALBUTEROL HCL 0.63 MG/3ML IN NEBU
0.6300 mg | INHALATION_SOLUTION | Freq: Four times a day (QID) | RESPIRATORY_TRACT | Status: DC | PRN
  Administered 2016-11-16 – 2016-11-18 (×3): 0.63 mg via RESPIRATORY_TRACT
  Filled 2016-11-16 (×3): qty 3

## 2016-11-16 MED ORDER — VANCOMYCIN HCL IN DEXTROSE 1-5 GM/200ML-% IV SOLN: 1000.0000 mg | Freq: Two times a day (BID) | INTRAVENOUS | Status: DC

## 2016-11-16 MED ORDER — METOPROLOL TARTRATE 5 MG/5ML IV SOLN
5.0000 mg | INTRAVENOUS | Status: DC | PRN
  Administered 2016-11-16 (×2): 5 mg via INTRAVENOUS
  Filled 2016-11-16 (×2): qty 5

## 2016-11-16 MED ORDER — SODIUM POLYSTYRENE SULFONATE 15 GM/60ML PO SUSP
30.0000 g | Freq: Once | ORAL | Status: AC
  Administered 2016-11-16: 30 g via ORAL
  Filled 2016-11-16: qty 120

## 2016-11-16 MED ORDER — METOPROLOL TARTRATE 25 MG PO TABS
25.0000 mg | ORAL_TABLET | Freq: Two times a day (BID) | ORAL | Status: DC
  Administered 2016-11-16 – 2016-11-19 (×6): 25 mg via ORAL
  Filled 2016-11-16 (×6): qty 1

## 2016-11-16 NOTE — Progress Notes (Signed)
11/16/2016 3:44 PM   I spoke with Dr. Terrence Dupont about Afib with RVR not responding to IV cardizem, will dc cardizem, will give 0.25 mg digoxin, may repeat in 30 mins, he is planning for TEE with possible cardioversion tomorrow, He will be seeing patient today.    Murvin Natal MD

## 2016-11-16 NOTE — Progress Notes (Signed)
Physical Therapy Treatment Patient Details Name: Mike Becker MRN: [id number] DOB: 04/25/1941 Today's Date: 11/16/2016    History of Present Illness 76 y.o. male with a past medical history significant for CAD s/p CABG 2004, COPD not on home O2, HTN and Afib declined AC who presents with 1 week progressive cough, weakness, dyspnea, orthopnea.;7/8 s/p thoracentesis removed 1.8L    PT Comments    Pt progressing slowly towards physical therapy goals. When PT entered, pt attempting to clean himself after having a bowel movement. O2 sats were at 83% on RA in bathroom. He required extensive assist to complete peri-care, and did not appear to be aware of O2 status despite labored breathing. Therapist assisted pt to don 4L/min supplemental O2 per RN, and sats improved to 92% after ~3 minutes. At this time, feel pt may benefit from continued therapy services at the SNF level to maximize functional independence and safety with mobility and ADL's prior to return home. Will continue to follow.     Follow Up Recommendations  SNF;Supervision/Assistance - 24 hour     Equipment Recommendations  Other (comment);Rolling walker with 5" wheels (continue to assess O2 needs)    Recommendations for Other Services OT consult     Precautions / Restrictions Precautions Precautions: Fall Precaution Comments: denies h/o falls; endorses recent imbalance due to illness Restrictions Weight Bearing Restrictions: No    Mobility  Bed Mobility Overal bed mobility: Modified Independent             General bed mobility comments: sit to supine  Transfers Overall transfer level: Needs assistance Equipment used: None Transfers: Sit to/from Stand Sit to Stand: Min guard         General transfer comment: Close guard for safety as pt powered-up to full standing position. Appears to have poor awareness of safety during standing activity to clean himself up after using the bathroom.    Ambulation/Gait Ambulation/Gait assistance: Min assist Ambulation Distance (Feet): 15 Feet Assistive device: Rolling walker (2 wheeled) Gait Pattern/deviations: Step-through pattern;Decreased stride length;Trunk flexed Gait velocity: Decreased Gait velocity interpretation: Below normal speed for age/gender General Gait Details: Decreased safety awareness as pt ambulated around room from bathroom to chair. Assist required for management of lines and walker management at times. Pt reports that he does not want to use the walker because he "doesn't know how". Was able to be redirected to use the walker with therapist assist.    Stairs            Wheelchair Mobility    Modified Rankin (Stroke Patients Only)       Balance Overall balance assessment: Needs assistance Sitting-balance support: No upper extremity supported;Feet supported Sitting balance-Leahy Scale: Fair     Standing balance support: No upper extremity supported Standing balance-Leahy Scale: Poor Standing balance comment: Requires UE support while standing to clean himself after using the bathroom                            Cognition Arousal/Alertness: Awake/alert Behavior During Therapy: WFL for tasks assessed/performed Overall Cognitive Status: Within Functional Limits for tasks assessed                                        Exercises      General Comments        Pertinent Vitals/Pain Pain Assessment: Faces Faces Pain Scale:  Hurts a little bit Pain Location: Pt reports tightness in chest after bout of activity. O2 sats 83% on RA while pt in bathroom. RN aware.  Pain Intervention(s): Repositioned;Utilized relaxation techniques (RN notified)    Home Living Family/patient expects to be discharged to:: Private residence Living Arrangements: Alone Available Help at Discharge: Family;Available PRN/intermittently Type of Home: Apartment Home Access: Level entry   Home  Layout: One level Home Equipment: Grab bars - tub/shower;Grab bars - toilet      Prior Function Level of Independence: Independent      Comments: still drives   PT Goals (current goals can now be found in the care plan section) Acute Rehab PT Goals Patient Stated Goal: obtain home O2 (as he had in [facility] prior to move to [facility]) PT Goal Formulation: With patient Time For Goal Achievement: 11/28/16 Potential to Achieve Goals: Good Progress towards PT goals: Progressing toward goals    Frequency    Min 3X/week      PT Plan Discharge plan needs to be updated    Co-evaluation              AM-PAC PT "6 Clicks" Daily Activity  Outcome Measure  Difficulty turning over in bed (including adjusting bedclothes, sheets and blankets)?: A Little Difficulty moving from lying on back to sitting on the side of the bed? : A Little Difficulty sitting down on and standing up from a chair with arms (e.g., wheelchair, bedside commode, etc,.)?: A Little Help needed moving to and from a bed to chair (including a wheelchair)?: A Little Help needed walking in hospital room?: A Lot Help needed climbing 3-5 steps with a railing? : A Lot 6 Click Score: 16    End of Session Equipment Utilized During Treatment: Oxygen Activity Tolerance: Treatment limited secondary to medical complications (Comment) (O2 status) Patient left: in chair;with call bell/phone within reach;with nursing/sitter in room Nurse Communication: Mobility status;Precautions PT Visit Diagnosis: Unsteadiness on feet (R26.81);Muscle weakness (generalized) (M62.81)     Time: 4431-5400 PT Time Calculation (min) (ACUTE ONLY): 21 min  Charges:  $Gait Training: 8-22 mins                    G Codes:       Rolinda Roan, PT, DPT Acute Rehabilitation Services Pager: [phone]    Thelma Comp 11/16/2016, 2:06 PM

## 2016-11-16 NOTE — Progress Notes (Signed)
Started cardizem gtt at 76mL/h for 30 min, pt. HR still 140-150 BP 87/64. Paged MD who will consult cards.   Holding gtt, received order for and gave Digoxin

## 2016-11-16 NOTE — Progress Notes (Signed)
[name]  Date of Admission:  11/13/2016    Total days of antibiotics 3         Day 3 of vancomycin.          ASSESSMENT: MRSA bacteremia secondary to pneumonia.Mike Becker developed MRSA bacteremia most likely secondary to pneumonia. His pleural fluid culture remained negative although Gram stain shows gram-positive cocci in cluster. He remained afebrile. Blood cultures were drawn this morning-results pending. He will get follow-up of his hilar [name] as mentioned in the primary team note.Chest x-ray ordered yesterday to monitor his pleural effusion is not done yet. TEE is still pending.   PLAN: 1. Continue vancomycin. 2. Repeat blood culture results and TEE pending-that will determine when to place PICC line and the duration of antibiotics. 3. If repeat chest x-ray shows worsening of pleural effusion-consider getting cardiothoracic consult.  Principal Problem:   MRSA bacteremia Active Problems:   Community acquired pneumonia of right lower lobe of lung ([name])   Sepsis ([name])   Pleural effusion, right   Acute on chronic diastolic CHF (congestive heart failure) ([name])   Elevated troponin   AKI (acute kidney injury) ([place])   Hyponatremia   Hyperkalemia   Essential hypertension   AF (paroxysmal atrial fibrillation) ([name])   Coronary artery disease due to lipid rich plaque   COPD (chronic obstructive pulmonary disease) ([name])   Pleural effusion on right   . arformoterol  15 mcg Nebulization BID  . aspirin EC  81 mg Oral Daily  . Chlorhexidine Gluconate Cloth  6 each Topical Q0600  . furosemide  20 mg Intravenous Once  . gabapentin  600 mg Oral TID  . heparin subcutaneous  5,000 Units Subcutaneous Q8H  . metoprolol tartrate  12.5 mg Oral BID  . mupirocin ointment  1 application Nasal BID  . nicotine  21 mg Transdermal Daily  . nystatin  5 mL Oral QID  . sodium polystyrene  30 g Oral Once  . tamsulosin  0.4 mg Oral BID  .  tiotropium  18 mcg Inhalation Daily    SUBJECTIVE: Patient was feeling little cold and was sitting on the side of bed with blanket around him, stating that he is always cold denies any chills or fever. Noted to have transient episode of A. fib with RVR with heart rate up to 150 on monitor while in the room, patient denied any shortness of breath, chest pain and palpitations. He denied any dizziness. He do complained of some chest soreness associated with excessive coughing but denied any ongoing chest pain. Heart rate improved to low  100s spontaneously.  Review of Systems: ROS  No Known Allergies  OBJECTIVE: Vitals:   11/15/16 2348 11/16/16 0400 11/16/16 0500 11/16/16 0826  BP: 120/69 125/71 125/71 123/68  Pulse: 85 94 90 92  Resp: 20 (!) 23 (!) 28 (!) 22  Temp: 97.7 F (36.5 C)  (!) 97.5 F (36.4 C) 98.6 F (37 C)  TempSrc: Oral  Oral Oral  SpO2: 90% 90% 96% 92%  Weight:   179 lb 14.3 oz (81.6 kg)   Height:       Body mass index is 22.49 kg/m.  Physical Exam   Gen. Well-developed elderly gentleman, in no acute distress. Lungs. Decreased breath sounds bilaterally from mid zone to lower zone more pronounced on right. CV. Patient regularly irregular with tachycardia. Extremities. 2+ pitting edema bilaterally.  Lab Results Lab Results  Component Value Date   WBC 10.7 (H) 11/16/2016   HGB 14.8 11/16/2016   HCT 46.5 11/16/2016   MCV 83.8 11/16/2016   PLT 227 11/16/2016    Lab Results  Component Value Date   CREATININE 0.95 11/16/2016   BUN 34 (H) 11/16/2016   NA 132 (L) 11/16/2016   K 5.6 (H) 11/16/2016   CL 100 (L) 11/16/2016   CO2 25 11/16/2016    Lab Results  Component Value Date   ALT 23 11/15/2016   AST 31 11/15/2016   ALKPHOS 139 (H) 11/15/2016   BILITOT 0.5 11/15/2016     Microbiology: Recent Results (from the past 240 hour(s))  Culture, blood (Routine x 2)     Status: None (Preliminary result)   Collection Time: 11/13/16  5:10 PM  Result Value Ref  Range Status   Specimen Description BLOOD RIGHT ANTECUBITAL  Final   Special Requests   Final    BOTTLES DRAWN AEROBIC AND ANAEROBIC Blood Culture adequate volume   Culture NO GROWTH 3 DAYS  Final   Report Status PENDING  Incomplete  Culture, blood (Routine x 2)     Status: None (Preliminary result)   Collection Time: 11/13/16  5:25 PM  Result Value Ref Range Status   Specimen Description BLOOD LEFT ANTECUBITAL  Final   Special Requests   Final    BOTTLES DRAWN AEROBIC AND ANAEROBIC Blood Culture adequate volume   Culture  Setup Time   Final    GRAM POSITIVE COCCI IN CLUSTERS ANAEROBIC BOTTLE ONLY CRITICAL RESULT CALLED TO, READ BACK BY AND VERIFIED WITH: [name],PHARMD 11/15/16 1455 [name]    Culture GRAM POSITIVE COCCI  Final   Report Status PENDING  Incomplete  Blood Culture ID Panel (Reflexed)     Status: Abnormal   Collection Time: 11/13/16  5:25 PM  Result Value Ref Range Status   Enterococcus species NOT DETECTED NOT DETECTED Final   Listeria monocytogenes NOT DETECTED NOT DETECTED Final   Staphylococcus species DETECTED (A) NOT DETECTED Final    Comment: CRITICAL RESULT CALLED TO, READ BACK BY AND VERIFIED WITH: [name], PHARMD 11/15/16 1455 [name]    Staphylococcus aureus DETECTED (A) NOT DETECTED Final    Comment: Methicillin (oxacillin)-resistant Staphylococcus aureus (MRSA). MRSA is predictably resistant to beta-lactam antibiotics (except ceftaroline). Preferred therapy is vancomycin unless clinically contraindicated. Patient requires contact precautions if  hospitalized. CRITICAL RESULT CALLED TO, READ BACK BY AND VERIFIED WITH: [name], PHARMD 11/15/16 [name]    Methicillin resistance DETECTED (A) NOT DETECTED Final    Comment: CRITICAL RESULT CALLED TO, READ BACK BY AND VERIFIED WITH: [name],PHARMD 11/15/16 1455 [name]    Streptococcus species NOT DETECTED NOT DETECTED Final   Streptococcus agalactiae NOT DETECTED NOT DETECTED Final   Streptococcus  pneumoniae NOT DETECTED NOT DETECTED Final   Streptococcus pyogenes NOT DETECTED NOT DETECTED Final   Acinetobacter baumannii NOT DETECTED NOT DETECTED Final   Enterobacteriaceae species NOT DETECTED NOT DETECTED Final   Enterobacter cloacae complex NOT DETECTED NOT DETECTED Final   Escherichia coli NOT DETECTED NOT DETECTED Final   Klebsiella oxytoca NOT DETECTED NOT DETECTED Final   Klebsiella pneumoniae NOT DETECTED NOT DETECTED Final   Proteus species NOT DETECTED NOT DETECTED Final   Serratia marcescens NOT DETECTED NOT DETECTED Final   Haemophilus influenzae NOT DETECTED NOT DETECTED Final   Neisseria meningitidis NOT DETECTED NOT DETECTED Final   Pseudomonas aeruginosa NOT DETECTED NOT DETECTED Final   Candida albicans NOT  DETECTED NOT DETECTED Final   Candida glabrata NOT DETECTED NOT DETECTED Final   Candida krusei NOT DETECTED NOT DETECTED Final   Candida parapsilosis NOT DETECTED NOT DETECTED Final   Candida tropicalis NOT DETECTED NOT DETECTED Final  Urine culture     Status: Abnormal   Collection Time: 11/13/16  5:34 PM  Result Value Ref Range Status   Specimen Description URINE, RANDOM  Final   Special Requests Normal  Final   Culture <10,000 COLONIES/mL INSIGNIFICANT GROWTH (A)  Final   Report Status 11/14/2016 FINAL  Final  MRSA PCR Screening     Status: Abnormal   Collection Time: 11/13/16 11:00 PM  Result Value Ref Range Status   MRSA by PCR POSITIVE (A) NEGATIVE Final    Comment:        The GeneXpert MRSA Assay (FDA approved for NASAL specimens only), is one component of a comprehensive MRSA colonization surveillance program. It is not intended to diagnose MRSA infection nor to guide or monitor treatment for MRSA infections. RESULT CALLED TO, READ BACK BY AND VERIFIED WITH: RN KARA MCKIBBEN [id number] @0233  THANEY   Culture, sputum-assessment     Status: None   Collection Time: 11/14/16  1:13 AM  Result Value Ref Range Status   Specimen Description SPUTUM   Final   Special Requests NONE  Final   Sputum evaluation   Final    Sputum specimen not acceptable for testing.  Please recollect.   Gram Stain Report Called to,Read Back By and Verified With: B RONCALO,RN AT 0740 11/14/16 BY L BENFIELD    Report Status 11/14/2016 FINAL  Final  Culture, body fluid-bottle     Status: None (Preliminary result)   Collection Time: 11/14/16  9:50 AM  Result Value Ref Range Status   Specimen Description FLUID RIGHT PLEURAL  Final   Special Requests NONE  Final   Culture NO GROWTH 2 DAYS  Final   Report Status PENDING  Incomplete  Gram stain     Status: None   Collection Time: 11/14/16  9:50 AM  Result Value Ref Range Status   Specimen Description FLUID RIGHT PLEURAL  Final   Special Requests NONE  Final   Gram Stain   Final    ABUNDANT WBC PRESENT, PREDOMINANTLY PMN RARE GRAM POSITIVE COCCI IN CLUSTERS    Report Status 11/14/2016 FINAL  Final    Lorella Nimrod, MD [facility] [phone] pager   [phone] cell 11/16/2016, 10:19 AM

## 2016-11-16 NOTE — Progress Notes (Signed)
Mike Becker  RJJ:[id number] DOB: 10-08-1940 DOA: 11/13/2016 PCP: Patient, No Pcp Per    Brief Narrative:  76 y.o. male with a history of CAD s/p CABG 2004, COPD, HTN, and Afib (declined AC) who presented with 1 week of progressive cough, weakness, leg swelling, dyspnea, and orthopnea.  Dr. Thereasa Solo spoke with Dr. Danton Sewer at the [facility] in [place].  Per his record, the only imaging they have on the patient is a CT of the abdomen in March 2018 which did reveal a 7 mm lung nodule at the right base.  Given the size of this lesion the plan was to simply monitor it with repeat imaging.  Imaging to include the hilum however has not been accomplished at the [place].  Dr. Hinda Kehr apparently reported the patient does have COPD with an FEV1/FVC of 32% predicted.  Subjective: The patient reports that he doesn't feel very good today.   He is not short of breath and he has no chest pain.   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 Gwenette Greet could perform this as an outpatient at the [facility] in [place] if the patient is otherwise stable.   Peripheral edema Likely due to low albumin and venous insuff - no evidence of signif CHF on TTE - elevated feet - TED hose as able   Mild throactic aortic aneurysm  4.1cm max AP dimension  Elevated troponin Only very mildly elevated -  likely related to RVR - follow clinically - no substernal chest pain  Recent Labs Lab 11/13/16 1828 11/13/16 2258 11/14/16 0552  TROPONINI 0.07* 0.04* 0.06*   Acute kidney injury Nearly resolved w/ volume expansion - follow while on Vanc   Recent Labs Lab 11/13/16 1714 11/14/16 0552 11/15/16 0304 11/16/16 0510  CREATININE 1.46* 1.26* 1.08 0.95    Chronic atrial fibrillation with acute RVR Chadsvasc is 4 - has previously refused anticoagulation - heart rate now well controlled - would not initiate full anticoag at this time as bx may be required He is on metoprolol 12.5 mg BID for rate control, would increase dose as needed  Possible Cirrhosis  Noted on CT chest - viral hep panel negative - INR normal    Hyperkalemia - treat with oral kayexalate, lasix IV x 1  Hyponatremia Volume depletion v/s SIADH - improving w/ volume expansion, arguing for the former   COPD Appears severe on CT chest - followed by Pulm at [facility] - well compensated at this time   HTN - blood pressures well controlled.   MRSA screen positive  DVT prophylaxis: lovenox  Code Status: DO NOT INTUBATE Family Communication: Spoke with patient and sister at bedside at length Disposition Plan: Stable for transfer to telemetry bed  Consultants:  ID  Procedures: 7/8 ultrasound-guided R thoracentesis - 1.8L turbid yellow fluid 7/8 - TTE EF60-65% - no WMA - mild LVH  Antimicrobials:  Ceftriaxone 7/7 >  7/8 Azithromycin 7/8  Vancomycin 7/8 >  Objective: Blood pressure 123/68, pulse 92, temperature 98.6 F (37 C), temperature source Oral, resp. rate (!) 22, height 6\' 3"  (1.905 m), weight 81.6 kg (179 lb 14.3 oz), SpO2 92 %.  Intake/Output Summary (Last 24 hours) at 11/16/16 0926 Last data filed at 11/16/16 0503  Gross per 24 hour  Intake              390 ml  Output             1595 ml  Net            -1205 ml   Filed Weights   11/14/16 0459 11/15/16 0400 11/16/16 0500  Weight: 81.4 kg (179 lb 6.4  oz) 79.7 kg (175 lb 12.8 oz) 81.6 kg (179 lb 14.3 oz)    Examination: General: No acute respiratory distress at rest in bed  Lungs: Improved air movement throughout right with persisting right basilar crackles - good air movement on left with no wheezing Cardiovascular: Regular rate without murmur or rub Abdomen: Nontender, nondistended, soft, bowel sounds positive, no rebound, no ascites, no appreciable mass Extremities: 2+ B pedal edema persists without significant change Neurological: nonfocal.   CBC:  Recent Labs Lab 11/13/16 1714 11/14/16 0552 11/15/16 0304 11/16/16 0510  WBC 17.5* 13.7* 11.2* 10.7*  NEUTROABS 16.0*  --   --   --   HGB 15.6 13.9 13.6 14.8  HCT 48.2 43.7 44.0 46.5  MCV 82.1 82.6 84.0 83.8  PLT 286 238 238 867   Basic Metabolic Panel:  Recent Labs Lab 11/13/16 1714 11/13/16 1828 11/13/16 2258 11/14/16 0552 11/15/16 0304 11/16/16 0510  NA 125*  --   --  127* 129* 132*  K 5.9* 5.9*  --  5.4* 5.4* 5.6*  CL 94*  --   --  98* 99* 100*  CO2 22  --   --  23 25 25   GLUCOSE 117*  --   --  132* 115* 102*  BUN 53*  --   --  53* 49* 34*  CREATININE 1.46*  --   --  1.26* 1.08 0.95  CALCIUM 8.8*  --   --  8.3* 8.2* 8.3*  MG  --   --  2.1  --   --   --    GFR: Estimated Creatinine Clearance: 75.2 mL/min (by C-G formula based on SCr of 0.95 mg/dL).  Liver Function Tests:  Recent Labs Lab 11/13/16 1714 11/15/16 0304  AST 37 31  ALT 25 23  ALKPHOS 186* 139*  BILITOT 1.0 0.5  PROT 6.9 5.6*  ALBUMIN 2.1* 1.6*    Coagulation Profile:  Recent Labs Lab 11/13/16 1714 11/14/16 1841  INR 1.14 1.15    Cardiac Enzymes:  Recent Labs Lab 11/13/16 1828 11/13/16 2258 11/14/16 0552  TROPONINI 0.07* 0.04* 0.06*    Recent Results (from the past 240 hour(s))  Culture, blood (Routine x 2)     Status: None (Preliminary result)   Collection Time: 11/13/16  5:10 PM  Result Value Ref Range Status   Specimen Description BLOOD RIGHT ANTECUBITAL  Final    Special Requests   Final    BOTTLES DRAWN AEROBIC AND ANAEROBIC Blood Culture adequate volume   Culture NO GROWTH 2 DAYS  Final   Report Status PENDING  Incomplete  Culture, blood (Routine x 2)     Status: None (Preliminary result)   Collection Time: 11/13/16  5:25 PM  Result Value Ref Range Status  Specimen Description BLOOD LEFT ANTECUBITAL  Final   Special Requests   Final    BOTTLES DRAWN AEROBIC AND ANAEROBIC Blood Culture adequate volume   Culture  Setup Time   Final    GRAM POSITIVE COCCI IN CLUSTERS ANAEROBIC BOTTLE ONLY CRITICAL RESULT CALLED TO, READ BACK BY AND VERIFIED WITH: [name],PHARMD 11/15/16 1455 [name]    Culture GRAM POSITIVE COCCI  Final   Report Status PENDING  Incomplete  Blood Culture ID Panel (Reflexed)     Status: Abnormal   Collection Time: 11/13/16  5:25 PM  Result Value Ref Range Status   Enterococcus species NOT DETECTED NOT DETECTED Final   Listeria monocytogenes NOT DETECTED NOT DETECTED Final   Staphylococcus species DETECTED (A) NOT DETECTED Final    Comment: CRITICAL RESULT CALLED TO, READ BACK BY AND VERIFIED WITH: [name], PHARMD 11/15/16 1455 [name]    Staphylococcus aureus DETECTED (A) NOT DETECTED Final    Comment: Methicillin (oxacillin)-resistant Staphylococcus aureus (MRSA). MRSA is predictably resistant to beta-lactam antibiotics (except ceftaroline). Preferred therapy is vancomycin unless clinically contraindicated. Patient requires contact precautions if  hospitalized. CRITICAL RESULT CALLED TO, READ BACK BY AND VERIFIED WITH: [name], PHARMD 11/15/16 [name]    Methicillin resistance DETECTED (A) NOT DETECTED Final    Comment: CRITICAL RESULT CALLED TO, READ BACK BY AND VERIFIED WITH: [name],PHARMD 11/15/16 1455 [name]    Streptococcus species NOT DETECTED NOT DETECTED Final   Streptococcus agalactiae NOT DETECTED NOT DETECTED Final   Streptococcus pneumoniae NOT DETECTED NOT DETECTED Final   Streptococcus pyogenes  NOT DETECTED NOT DETECTED Final   Acinetobacter baumannii NOT DETECTED NOT DETECTED Final   Enterobacteriaceae species NOT DETECTED NOT DETECTED Final   Enterobacter cloacae complex NOT DETECTED NOT DETECTED Final   Escherichia coli NOT DETECTED NOT DETECTED Final   Klebsiella oxytoca NOT DETECTED NOT DETECTED Final   Klebsiella pneumoniae NOT DETECTED NOT DETECTED Final   Proteus species NOT DETECTED NOT DETECTED Final   Serratia marcescens NOT DETECTED NOT DETECTED Final   Haemophilus influenzae NOT DETECTED NOT DETECTED Final   Neisseria meningitidis NOT DETECTED NOT DETECTED Final   Pseudomonas aeruginosa NOT DETECTED NOT DETECTED Final   Candida albicans NOT DETECTED NOT DETECTED Final   Candida glabrata NOT DETECTED NOT DETECTED Final   Candida krusei NOT DETECTED NOT DETECTED Final   Candida parapsilosis NOT DETECTED NOT DETECTED Final   Candida tropicalis NOT DETECTED NOT DETECTED Final  Urine culture     Status: Abnormal   Collection Time: 11/13/16  5:34 PM  Result Value Ref Range Status   Specimen Description URINE, RANDOM  Final   Special Requests Normal  Final   Culture <10,000 COLONIES/mL INSIGNIFICANT GROWTH (A)  Final   Report Status 11/14/2016 FINAL  Final  MRSA PCR Screening     Status: Abnormal   Collection Time: 11/13/16 11:00 PM  Result Value Ref Range Status   MRSA by PCR POSITIVE (A) NEGATIVE Final    Comment:        The GeneXpert MRSA Assay (FDA approved for NASAL specimens only), is one component of a comprehensive MRSA colonization surveillance program. It is not intended to diagnose MRSA infection nor to guide or monitor treatment for MRSA infections. RESULT CALLED TO, READ BACK BY AND VERIFIED WITH: RN KARA MCKIBBEN [id number] @0233  THANEY   Culture, sputum-assessment     Status: None   Collection Time: 11/14/16  1:13 AM  Result Value Ref Range Status  Specimen Description SPUTUM  Final   Special Requests NONE  Final   Sputum evaluation   Final     Sputum specimen not acceptable for testing.  Please recollect.   Gram Stain Report Called to,Read Back By and Verified With: B RONCALO,RN AT 0740 11/14/16 BY L BENFIELD    Report Status 11/14/2016 FINAL  Final  Culture, body fluid-bottle     Status: None (Preliminary result)   Collection Time: 11/14/16  9:50 AM  Result Value Ref Range Status   Specimen Description FLUID RIGHT PLEURAL  Final   Special Requests NONE  Final   Culture NO GROWTH 1 DAY  Final   Report Status PENDING  Incomplete  Gram stain     Status: None   Collection Time: 11/14/16  9:50 AM  Result Value Ref Range Status   Specimen Description FLUID RIGHT PLEURAL  Final   Special Requests NONE  Final   Gram Stain   Final    ABUNDANT WBC PRESENT, PREDOMINANTLY PMN RARE GRAM POSITIVE COCCI IN CLUSTERS    Report Status 11/14/2016 FINAL  Final     Scheduled Meds: . arformoterol  15 mcg Nebulization BID  . aspirin EC  81 mg Oral Daily  . Chlorhexidine Gluconate Cloth  6 each Topical Q0600  . gabapentin  600 mg Oral TID  . heparin subcutaneous  5,000 Units Subcutaneous Q8H  . metoprolol tartrate  12.5 mg Oral BID  . mupirocin ointment  1 application Nasal BID  . nicotine  21 mg Transdermal Daily  . nystatin  5 mL Oral QID  . tamsulosin  0.4 mg Oral BID  . tiotropium  18 mcg Inhalation Daily     LOS: 3 days   Murvin Natal MD [facility] Office  [phone] Pager - Text Page per Shea Evans as per below:  On-Call/Text Page:      [email or web address]      password [email or web address]  If 7PM-7AM, please contact night-coverage [email or web address] Password [email or web address] 11/16/2016, 9:26 AM

## 2016-11-16 NOTE — Progress Notes (Signed)
Pharmacy Antibiotic Note Mike Becker is a 76 y.o. male admitted on 11/13/2016 with worsening SOB. Currently with MRSA bacteremia per BCID and GPC on gram stain from pleural fluid. Pharmacy following for vancomycin.   SCr improved since admission. Repeat Bcx's taken this am.   Plan: 1. Adjust vancomycin 1 gram IV every 12 hours  2. Obtain vancomycin trough at SS; goal trough 15-20 3. SCr every 72 hours while on vancomycin   Height: 6\' 3"  (190.5 cm) Weight: 179 lb 14.3 oz (81.6 kg) IBW/kg (Calculated) : 84.5  Temp (24hrs), Avg:97.9 F (36.6 C), Min:97.5 F (36.4 C), Max:98.6 F (37 C)   Recent Labs Lab 11/13/16 1714 11/13/16 1729 11/13/16 1944 11/14/16 0552 11/15/16 0304 11/16/16 0510  WBC 17.5*  --   --  13.7* 11.2* 10.7*  CREATININE 1.46*  --   --  1.26* 1.08 0.95  LATICACIDVEN  --  2.10* 1.86  --   --   --     Estimated Creatinine Clearance: 75.2 mL/min (by C-G formula based on SCr of 0.95 mg/dL).    No Known Allergies  Antimicrobials this admission: 7/8 Ceftriaxone >> 7/9 7/8 Azithromycin >>7/9  7/8 vancomycin >>   Microbiology results: 7/7 BCx:  MRSA per BCID 7/7 UCx: ngF 7/7 strep pna: negative  7/7 MRSA PCR: positive  7/8: pleural fluid:  rare GPC (gram stain) 7/10 BCx: px  Thank you for allowing pharmacy to be a part of this patient's care.  Vincenza Hews, PharmD, BCPS 11/16/2016, 9:12 AM

## 2016-11-16 NOTE — Consult Note (Signed)
Reason for Consult: A. fib with RVR Referring Physician: [facility] hospitalist  Mike Becker is an 76 y.o. male.  HPI: Patient is 76 year old male with past medical history significant for coronary artery disease history of MI in the past status post CABG 3 in 2004 in [place], hypertension, COPD on home O2, chronic atrial fibrillation chadsvasc score of 4 refused anticoagulation in the past was admitted on 76 because of cough associated with progressive dyspnea on exertion and generalized weakness and was noted to have right lower lobe pneumonia complicated by parapneumonic effusion requiring thoracentesis. Cardiologic consultation was called as patient was noted to be in A. fib with RVR and also had MRSA bacteremia for possible TEE to rule out endocarditis. Patient denies any chest pain or shortness of breath at present. Denies palpitations. Noted to be in A. fib with RVR received earlier Cardizem bolus with drop in blood pressure in 80s subsequently had 2 doses of IV digoxin without much improvement in heart rate. Patient received metoprolol IV 5 mg with transient improvement in his heart rate today.  Past Medical History:  Diagnosis Date  . A-fib ([place])   . COPD (chronic obstructive pulmonary disease) ([place])   . Dyspnea   . Dysrhythmia   . Hypertension   . MI (myocardial infarction) [facility])     Past Surgical History:  Procedure Laterality Date  . EXPLORATION POST OPERATIVE OPEN HEART    . triple bypass      Family History  Problem Relation Age of Onset  . Diabetes Mother   . Congestive Heart Failure Mother   . Congenital heart disease Mother   . Diabetes Brother     Social History:  reports that he has been smoking Cigarettes.  He has a 50.00 pack-year smoking history. He has never used smokeless tobacco. He reports that he does not drink alcohol or use drugs.  Allergies: No Known Allergies  Medications: I have reviewed the patient's current  medications.  Results for orders placed or performed during the hospital encounter of 11/13/16 (from the past 48 hour(s))  Protime-INR     Status: None   Collection Time: 11/14/16  6:41 PM  Result Value Ref Range   Prothrombin Time 14.8 11.4 - 15.2 seconds   INR 1.15   Hepatitis panel, acute     Status: None   Collection Time: 11/14/16  6:41 PM  Result Value Ref Range   Hepatitis B Surface Ag Negative Negative   HCV Ab <0.1 0.0 - 0.9 s/co ratio    Comment: (NOTE)                                  Negative:     < 0.8                             Indeterminate: 0.8 - 0.9                                  Positive:     > 0.9 The CDC recommends that a positive HCV antibody result be followed up with a HCV Nucleic Acid Amplification test ([id number]). Performed At: [facility] [place], [place] [id number] Lindon Romp MD OI:[id number]    Hep A IgM Negative Negative   Hep B C IgM Negative Negative  Comprehensive metabolic panel     Status: Abnormal   Collection Time: 11/15/16  3:04 AM  Result Value Ref Range   Sodium 129 (L) 135 - 145 mmol/L   Potassium 5.4 (H) 3.5 - 5.1 mmol/L   Chloride 99 (L) 101 - 111 mmol/L   CO2 25 22 - 32 mmol/L   Glucose, Bld 115 (H) 65 - 99 mg/dL   BUN 49 (H) 6 - 20 mg/dL   Creatinine, Ser 4.40 0.61 - 1.24 mg/dL   Calcium 8.2 (L) 8.9 - 10.3 mg/dL   Total Protein 5.6 (L) 6.5 - 8.1 g/dL   Albumin 1.6 (L) 3.5 - 5.0 g/dL   AST 31 15 - 41 U/L   ALT 23 17 - 63 U/L   Alkaline Phosphatase 139 (H) 38 - 126 U/L   Total Bilirubin 0.5 0.3 - 1.2 mg/dL   GFR calc non Af Amer >60 >60 mL/min   GFR calc Af Amer >60 >60 mL/min    Comment: (NOTE) The eGFR has been calculated using the CKD EPI equation. This calculation has not been validated in all clinical situations. eGFR's persistently <60 mL/min signify possible Chronic Kidney Disease.    Anion gap 5 5 - 15  CBC     Status: Abnormal   Collection Time: 11/15/16  3:04 AM  Result Value Ref  Range   WBC 11.2 (H) 4.0 - 10.5 K/uL   RBC 5.24 4.22 - 5.81 MIL/uL   Hemoglobin 13.6 13.0 - 17.0 g/dL   HCT 34.7 42.5 - 95.6 %   MCV 84.0 78.0 - 100.0 fL   MCH 26.0 26.0 - 34.0 pg   MCHC 30.9 30.0 - 36.0 g/dL   RDW 38.7 56.4 - 33.2 %   Platelets 238 150 - 400 K/uL  CBC     Status: Abnormal   Collection Time: 11/16/16  5:10 AM  Result Value Ref Range   WBC 10.7 (H) 4.0 - 10.5 K/uL   RBC 5.55 4.22 - 5.81 MIL/uL   Hemoglobin 14.8 13.0 - 17.0 g/dL   HCT 95.1 88.4 - 16.6 %   MCV 83.8 78.0 - 100.0 fL   MCH 26.7 26.0 - 34.0 pg   MCHC 31.8 30.0 - 36.0 g/dL   RDW 06.3 01.6 - 01.0 %   Platelets 227 150 - 400 K/uL  Basic metabolic panel     Status: Abnormal   Collection Time: 11/16/16  5:10 AM  Result Value Ref Range   Sodium 132 (L) 135 - 145 mmol/L   Potassium 5.6 (H) 3.5 - 5.1 mmol/L   Chloride 100 (L) 101 - 111 mmol/L   CO2 25 22 - 32 mmol/L   Glucose, Bld 102 (H) 65 - 99 mg/dL   BUN 34 (H) 6 - 20 mg/dL   Creatinine, Ser 9.32 0.61 - 1.24 mg/dL   Calcium 8.3 (L) 8.9 - 10.3 mg/dL   GFR calc non Af Amer >60 >60 mL/min   GFR calc Af Amer >60 >60 mL/min    Comment: (NOTE) The eGFR has been calculated using the CKD EPI equation. This calculation has not been validated in all clinical situations. eGFR's persistently <60 mL/min signify possible Chronic Kidney Disease.    Anion gap 7 5 - 15    Dg Chest 2 View  Result Date: 11/16/2016 CLINICAL DATA:  Pleural effusion.  Pneumonia EXAM: CHEST  2 VIEW COMPARISON:  Two days ago FINDINGS: Mild reaccumulation of right pleural effusion, but not as extensive as that seen at admission.  No apical pneumothorax is seen. By CT there is a large mass at the right base, with asymmetric interstitial thickening. Mediastinal adenopathy. No cardiomegaly. Emphysema. 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 Electronically Signed   By: Monte Fantasia M.D.   On: 11/16/2016 13:09     Review of Systems  Constitutional: Positive for malaise/fatigue.  Respiratory: Positive for cough and shortness of breath.   Cardiovascular: Positive for leg swelling. Negative for chest pain.  Gastrointestinal: Negative for nausea.  Genitourinary: Negative for dysuria.  Neurological: Positive for weakness. Negative for dizziness.   Blood pressure 98/62, pulse (!) 34, temperature 97.8 F (36.6 C), temperature source Oral, resp. rate (!) 24, height 6' 3" (1.905 m), weight 81.6 kg (179 lb 14.3 oz), SpO2 95 %. Physical Exam  Constitutional: He is oriented to person, place, and time.  Eyes: Conjunctivae are normal. Left eye exhibits no discharge. No scleral icterus.  Neck: Neck supple. No JVD present. No tracheal deviation present. No thyromegaly present.  Cardiovascular:  Tachycardic irregularly irregular S1-S2 soft  Respiratory:  Decrease pressor at bases with occasional bilateral rhonchi  GI: Soft. Bowel sounds are normal. He exhibits no distension. There is no tenderness. There is no rebound.  Musculoskeletal:  No clubbing cyanosis 1+ edema noted  Neurological: He is alert and oriented to person, place, and time.    Assessment/Plan: Chronic A. fib with RVR chadsvasc score of 4 Right lung pneumonia with parapneumonic effusion Moderate aortic regurgitation MRSA bacteremia probably secondary to above rule out endocarditis Coronary artery disease history of MI in the past status post CABG 3 in 2004 in [place] Hypertension History of diastolic congestive heart failure COPD on home O2 Plan Increase metoprolol to 25 mg twice daily Start metoprolol IV 2.5 mg every 4 hours for heart rate above 100. Start IV heparin per pharmacy protocol discussed with patient and agrees for anticoagulation. Also discussed with patient regarding TEE and possible cardioversion if no evidence of left atrial appendage thrombus and also no evidence of vegetation and agrees. Patient  received 5 mg IV Lopressor with control of heart rate between 100-110. We will increase by mouth and IV metoprolol as above Keep nothing by mouth after midnight.  Dr. Doylene Canard to perform TEE cardioversion tomorrow  Charolette Forward 11/16/2016, 6:35 PM

## 2016-11-16 NOTE — Progress Notes (Addendum)
Patient in atrial fib with RVR, HR 130-150s, completed EKG confirmed RVR. Complaining of shortness of breath. Lung sounds auscultated- crackles/diminished in R base. Pt. also having coughing while eating, says this is normal. Patient made NPO, ordered speech eval.  Paged MD, ordered lopressor HR>150.  Lopressor administered x1, HR remains in 130-140s. Spoke with MD at bedside, new order to start cardizem gtt.

## 2016-11-16 NOTE — Care Management Note (Addendum)
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  Pt is [facility] with [facility].  CM left voicemail for [employer] at [facility] to inform of admit.  Pt is currently requiring HFNC and may need HH or SNF at discharge - CM will continue to follow for discharge needs   Expected Discharge Date:                  Expected Discharge Plan:     In-House Referral:     Discharge planning Services  CM Consult  Post Acute Care Choice:    Choice offered to:     DME Arranged:    DME Agency:     HH Arranged:    [place] Agency:     Status of Service:     If discussed at [employer], dates discussed:    Additional Comments: 11/16/2016  SNF recommended -  CSW consulted  CM received call back from [facility] [phone].  Pt is non service connected- but if he chooses to utilize DME and [place] from [place] he has the benefit, pt can also utilize [employer] [place].  [name], RN 11/16/2016, 10:17 AM

## 2016-11-16 NOTE — Progress Notes (Addendum)
ANTICOAGULATION CONSULT NOTE - Initial Consult  Pharmacy Consult for heparin Indication: atrial fibrillation  No Known Allergies  Patient Measurements: Height: 6\' 3"  (190.5 cm) Weight: 179 lb 14.3 oz (81.6 kg) IBW/kg (Calculated) : 84.5 Heparin Dosing Weight: 82  Vital Signs: Temp: 97.8 F (36.6 C) (07/10 1501) Temp Source: Oral (07/10 1501) BP: 98/62 (07/10 1715) Pulse Rate: 34 (07/10 1730)  Labs:  Recent Labs  11/13/16 2258  11/14/16 0552 11/14/16 1841 11/15/16 0304 11/16/16 0510  HGB  --   < > 13.9  --  13.6 14.8  HCT  --   --  43.7  --  44.0 46.5  PLT  --   --  238  --  238 227  LABPROT  --   --   --  14.8  --   --   INR  --   --   --  1.15  --   --   CREATININE  --   --  1.26*  --  1.08 0.95  TROPONINI 0.04*  --  0.06*  --   --   --   < > = values in this interval not displayed.  Estimated Creatinine Clearance: 75.2 mL/min (by C-G formula based on SCr of 0.95 mg/dL).   Medical History: Past Medical History:  Diagnosis Date  . A-fib ([place])   . COPD (chronic obstructive pulmonary disease) ([place])   . Dyspnea   . Dysrhythmia   . Hypertension   . MI (myocardial infarction) ([place])     Medications:  Scheduled:  . arformoterol  15 mcg Nebulization BID  . aspirin EC  81 mg Oral Daily  . Chlorhexidine Gluconate Cloth  6 each Topical Q0600  . gabapentin  600 mg Oral TID  . metoprolol tartrate  25 mg Oral BID  . mupirocin ointment  1 application Nasal BID  . nicotine  21 mg Transdermal Daily  . nystatin  5 mL Oral QID  . tamsulosin  0.4 mg Oral BID  . tiotropium  18 mcg Inhalation Daily    Assessment: 76yo male to start heparin for AFib with RVR.  Pt has been receiving heparin SQ for VTE prophylaxis, last dose ~3PM.  Hg and pltc are wnl.  For TEE and possible cardioversion on 7/11.  Goal of Therapy:  Heparin level 0.3-0.7 units/ml Monitor platelets by anticoagulation protocol: Yes   Plan:  Heparin 1300 units/hr Will give small bolus of Heparin 1000  units IV x 1 Check heparin level in 8hr Daily heparin level, CBC Watch for s/s of bleeding  Lewie Chamber., PharmD [facility]

## 2016-11-17 ENCOUNTER — Encounter (HOSPITAL_COMMUNITY)
Admission: EM | Disposition: A | Discharge status: Skilled Nursing Facility | Payer: Self-pay | Source: Home / Self Care | Attending: Internal Medicine

## 2016-11-17 ENCOUNTER — Encounter (HOSPITAL_COMMUNITY): Payer: Self-pay

## 2016-11-17 ENCOUNTER — Inpatient Hospital Stay (HOSPITAL_COMMUNITY): Payer: Medicare PPO

## 2016-11-17 HISTORY — PX: TEE WITHOUT CARDIOVERSION: SHX5443

## 2016-11-17 LAB — CULTURE, BLOOD (ROUTINE X 2): SPECIAL REQUESTS: ADEQUATE

## 2016-11-17 LAB — BASIC METABOLIC PANEL
ANION GAP: 4 — AB (ref 5–15)
BUN: 27 mg/dL — ABNORMAL HIGH (ref 6–20)
CALCIUM: 8.1 mg/dL — AB (ref 8.9–10.3)
CO2: 29 mmol/L (ref 22–32)
Chloride: 102 mmol/L (ref 101–111)
Creatinine, Ser: 0.83 mg/dL (ref 0.61–1.24)
GLUCOSE: 120 mg/dL — AB (ref 65–99)
POTASSIUM: 4.7 mmol/L (ref 3.5–5.1)
Sodium: 135 mmol/L (ref 135–145)

## 2016-11-17 LAB — HEPARIN LEVEL (UNFRACTIONATED)
HEPARIN UNFRACTIONATED: 0.17 [IU]/mL — AB (ref 0.30–0.70)
Heparin Unfractionated: 0.26 IU/mL — ABNORMAL LOW (ref 0.30–0.70)
Heparin Unfractionated: 0.56 IU/mL (ref 0.30–0.70)

## 2016-11-17 LAB — CBC
HEMATOCRIT: 44.7 % (ref 39.0–52.0)
HEMOGLOBIN: 13.8 g/dL (ref 13.0–17.0)
MCH: 25.6 pg — ABNORMAL LOW (ref 26.0–34.0)
MCHC: 30.9 g/dL (ref 30.0–36.0)
MCV: 82.9 fL (ref 78.0–100.0)
Platelets: 210 10*3/uL (ref 150–400)
RBC: 5.39 MIL/uL (ref 4.22–5.81)
RDW: 15.2 % (ref 11.5–15.5)
WBC: 10.3 10*3/uL (ref 4.0–10.5)

## 2016-11-17 LAB — MAGNESIUM: Magnesium: 1.8 mg/dL (ref 1.7–2.4)

## 2016-11-17 SURGERY — ECHOCARDIOGRAM, TRANSESOPHAGEAL
Anesthesia: Moderate Sedation

## 2016-11-17 MED ORDER — MIDAZOLAM HCL 10 MG/2ML IJ SOLN
INTRAMUSCULAR | Status: DC | PRN
Start: 2016-11-17 — End: 2016-11-17
  Administered 2016-11-17 (×2): 1 mg via INTRAVENOUS

## 2016-11-17 MED ORDER — HEPARIN BOLUS VIA INFUSION
2000.0000 [IU] | Freq: Once | INTRAVENOUS | Status: AC
Start: 2016-11-17 — End: 2016-11-17
  Administered 2016-11-17: 2000 [IU] via INTRAVENOUS
  Filled 2016-11-17: qty 2000

## 2016-11-17 MED ORDER — BUTAMBEN-TETRACAINE-BENZOCAINE 2-2-14 % EX AERO
INHALATION_SPRAY | CUTANEOUS | Status: DC | PRN
  Administered 2016-11-17: 2 via TOPICAL

## 2016-11-17 MED ORDER — SODIUM CHLORIDE 0.9 % IV SOLN: INTRAVENOUS | Status: DC

## 2016-11-17 MED ORDER — FENTANYL CITRATE (PF) 100 MCG/2ML IJ SOLN
INTRAMUSCULAR | Status: AC
Start: 2016-11-17 — End: 2016-11-17
  Filled 2016-11-17: qty 2

## 2016-11-17 MED ORDER — FENTANYL CITRATE (PF) 100 MCG/2ML IJ SOLN
INTRAMUSCULAR | Status: DC | PRN
  Administered 2016-11-17: 12.5 ug via INTRAVENOUS

## 2016-11-17 MED ORDER — MIDAZOLAM HCL 5 MG/ML IJ SOLN
INTRAMUSCULAR | Status: AC
  Filled 2016-11-17: qty 2

## 2016-11-17 NOTE — Progress Notes (Signed)
SLP Cancellation Note  Patient Details Name: Mike Becker MRN: [id number] DOB: Aug 13, 1940   Cancelled treatment:       Reason Eval/Treat Not Completed: Medical issues which prohibited therapy. Orders received for BSE. Pt is currently NPO for TEE this afternoon. Will complete BSE next date. RN aware.  Celia B. [place], [facility], [place]  Shonna Chock 11/17/2016, 11:48 AM

## 2016-11-17 NOTE — Progress Notes (Signed)
[place] for heparin Indication: atrial fibrillation  No Known Allergies  Patient Measurements: Height: 6\' 3"  (190.5 cm) Weight: 175 lb 14.8 oz (79.8 kg) IBW/kg (Calculated) : 84.5 Heparin Dosing Weight: 82  Assessment: 76yo male on for AFib with RVR.  For TEE and possible cardioversion on 7/11. -heparin level is 0.56, therapeutic after heparin  rebolused and rate increased to 1650 units/hr    Goal of Therapy:  Heparin level 0.3-0.7 units/ml Monitor platelets by anticoagulation protocol: Yes   Plan:  -Continue Heparin gtt at 1650 units/hr -Heparin level  daily wth CBC daily  Nicole Cella, RPh Clinical Pharmacist Pager: [phone]  11/17/2016 9:47 PM

## 2016-11-17 NOTE — Progress Notes (Signed)
OT Cancellation Note  Patient Details Name: Mike Becker MRN: [id number] DOB: [birth year]   Cancelled Treatment:    Reason Eval/Treat Not Completed: Patient at procedure or test/ unavailable.  [name] 11/17/2016, 1:42 PM  [phone]

## 2016-11-17 NOTE — Progress Notes (Signed)
[place] for heparin Indication: atrial fibrillation  No Known Allergies  Patient Measurements: Height: 6\' 3"  (190.5 cm) Weight: 175 lb 14.8 oz (79.8 kg) IBW/kg (Calculated) : 84.5 Heparin Dosing Weight: 82  Assessment: 76yo male on for AFib with RVR.  For TEE and possible cardioversion on 7/11. -heparin level remains low after increase to 1400 units/hr     Goal of Therapy:  Heparin level 0.3-0.7 units/ml Monitor platelets by anticoagulation protocol: Yes   Plan:  -Heparin 2000 unit bolus then ncrease heparin gtt to 1650 units/hr -Heparin level in 8 hours and daily wth CBC daily  Hildred Laser, Pharm D 11/17/2016 1:07 PM

## 2016-11-17 NOTE — Progress Notes (Signed)
ANTICOAGULATION CONSULT NOTE - Initial Consult  Pharmacy Consult for heparin Indication: atrial fibrillation  No Known Allergies  Patient Measurements: Height: 6\' 3"  (190.5 cm) Weight: 179 lb 14.3 oz (81.6 kg) IBW/kg (Calculated) : 84.5 Heparin Dosing Weight: 82  Assessment: 76yo male to start heparin for AFib with RVR.  Pt has been receiving heparin SQ for VTE prophylaxis, last dose ~3PM.  Hg and pltc are wnl.  For TEE and possible cardioversion on 7/11.  First heparin level borderline low at 0.26.  Goal of Therapy:  Heparin level 0.3-0.7 units/ml Monitor platelets by anticoagulation protocol: Yes   Plan:  Increase heparin gtt to 1,400 units/hr Check 8 hr heparin level Monitor daily heparin level, CBC, s/s of bleed  Elenor Quinones, PharmD, [facility] Clinical Pharmacist Pager [phone] 11/17/2016 2:59 AM

## 2016-11-17 NOTE — Progress Notes (Signed)
While recovering patient for TEE, noticed change in heart rhythm.  Upon closer inspection, pt appeared to be in A. Fib. Dr. Doylene Canard made aware.  Informed that medication will be used to treat once blood pressure increases.

## 2016-11-17 NOTE — Consult Note (Signed)
Ref: Patient, No Pcp Per   Subjective:  MRSA bacteremia. VS stable. He has no difficulty swallowing food.  Objective:  Vital Signs in the last 24 hours: Temp:  [97.3 F (36.3 C)-98.6 F (37 C)] 97.3 F (36.3 C) (07/11 0807) Pulse Rate:  [28-149] 97 (07/11 0807) Cardiac Rhythm: Atrial fibrillation;Bundle branch block (07/11 0700) Resp:  [18-93] 20 (07/11 0807) BP: (98-115)/(60-90) 113/71 (07/11 0807) SpO2:  [90 %-100 %] 97 % (07/11 0807) Weight:  [79.8 kg (175 lb 14.8 oz)] 79.8 kg (175 lb 14.8 oz) (07/11 0451)  Physical Exam: BP Readings from Last 1 Encounters:  11/17/16 113/71    Wt Readings from Last 1 Encounters:  11/17/16 79.8 kg (175 lb 14.8 oz)    Weight change: -1.8 kg (-3 lb 15.5 oz) Body mass index is 21.99 kg/m. HEENT: [place]/AT, Eyes-[place], PERL, EOMI, Conjunctiva-Pink, Sclera-Non-icteric Neck: No JVD, No bruit, Trachea midline. Lungs:  Clear, Bilateral. Cardiac:  Regular rhythm, normal S1 and S2, no S3. II/VI systolic murmur. Abdomen:  Soft, non-tender. BS present. Extremities:  No edema present. No cyanosis. No clubbing. CNS: AxOx3, Cranial nerves grossly intact, moves all 4 extremities.  Skin: Warm and dry.   Intake/Output from previous day: 07/10 0701 - 07/11 0700 In: 1300.4 [P.O.:600; I.V.:150.4; IV Piggyback:550] Out: 841 [Urine:875]    Lab Results: BMET    Component Value Date/Time   NA 135 11/17/2016 0229   NA 132 (L) 11/16/2016 0510   NA 129 (L) 11/15/2016 0304   K 4.7 11/17/2016 0229   K 5.6 (H) 11/16/2016 0510   K 5.4 (H) 11/15/2016 0304   CL 102 11/17/2016 0229   CL 100 (L) 11/16/2016 0510   CL 99 (L) 11/15/2016 0304   CO2 29 11/17/2016 0229   CO2 25 11/16/2016 0510   CO2 25 11/15/2016 0304   GLUCOSE 120 (H) 11/17/2016 0229   GLUCOSE 102 (H) 11/16/2016 0510   GLUCOSE 115 (H) 11/15/2016 0304   BUN 27 (H) 11/17/2016 0229   BUN 34 (H) 11/16/2016 0510   BUN 49 (H) 11/15/2016 0304   CREATININE 0.83 11/17/2016 0229   CREATININE 0.95  11/16/2016 0510   CREATININE 1.08 11/15/2016 0304   CALCIUM 8.1 (L) 11/17/2016 0229   CALCIUM 8.3 (L) 11/16/2016 0510   CALCIUM 8.2 (L) 11/15/2016 0304   GFRNONAA >60 11/17/2016 0229   GFRNONAA >60 11/16/2016 0510   GFRNONAA >60 11/15/2016 0304   GFRAA >60 11/17/2016 0229   GFRAA >60 11/16/2016 0510   GFRAA >60 11/15/2016 0304   CBC    Component Value Date/Time   WBC 10.3 11/17/2016 0229   RBC 5.39 11/17/2016 0229   HGB 13.8 11/17/2016 0229   HCT 44.7 11/17/2016 0229   PLT 210 11/17/2016 0229   MCV 82.9 11/17/2016 0229   MCH 25.6 (L) 11/17/2016 0229   MCHC 30.9 11/17/2016 0229   RDW 15.2 11/17/2016 0229   LYMPHSABS 0.9 11/13/2016 1714   MONOABS 0.6 11/13/2016 1714   EOSABS 0.0 11/13/2016 1714   BASOSABS 0.0 11/13/2016 1714   HEPATIC Function Panel  Recent Labs  11/13/16 1714 11/15/16 0304  PROT 6.9 5.6*   HEMOGLOBIN A1C No components found for: HGA1C,  MPG CARDIAC ENZYMES Lab Results  Component Value Date   TROPONINI 0.06 (HH) 11/14/2016   TROPONINI 0.04 (HH) 11/13/2016   TROPONINI 0.07 (HH) 11/13/2016   BNP No results for input(s): PROBNP in the last 8760 hours. TSH  Recent Labs  11/13/16 2258  TSH 0.292*   CHOLESTEROL No results  for input(s): CHOL in the last 8760 hours.  Scheduled Meds: . arformoterol  15 mcg Nebulization BID  . aspirin EC  81 mg Oral Daily  . Chlorhexidine Gluconate Cloth  6 each Topical Q0600  . gabapentin  600 mg Oral TID  . metoprolol tartrate  25 mg Oral BID  . mupirocin ointment  1 application Nasal BID  . nicotine  21 mg Transdermal Daily  . nystatin  5 mL Oral QID  . tamsulosin  0.4 mg Oral BID  . tiotropium  18 mcg Inhalation Daily   Continuous Infusions: . heparin 1,400 Units/hr (11/17/16 0301)  . vancomycin Stopped (11/17/16 0627)   PRN Meds:.acetaminophen **OR** acetaminophen, diphenhydrAMINE, levalbuterol, metoprolol tartrate, ondansetron **OR** ondansetron (ZOFRAN) IV  Assessment/Plan: MRSA bacteremia r/o  vegetation Large lung mass Thoracic aortic aneurysm Chronic atrial fibrillation with RVR COPD Hypertension  TEE today. Patient agreeable to the procedure. Possible external cardioversion   LOS: 4 days    Dixie Dials  MD  11/17/2016, 8:59 AM

## 2016-11-17 NOTE — Clinical Social Work Note (Signed)
CSW met with patient to discuss SNF placement. No supports at bedside. CSW refusing SNF. He stated he feels comfortable returning home and just wants to be able to get home oxygen. Patient states that when he lived in [place] he was able to get it but not when he moved here. CSW explained that he would have to qualify for oxygen. RNCM notified of conversation.  CSW signing off. Consult again if any other social work needs arise.  Mike Becker, [place]

## 2016-11-17 NOTE — Progress Notes (Signed)
[name]  TML:[id number] DOB: 1940-08-26 DOA: 11/13/2016 PCP: Patient, No Pcp Per    Brief Narrative:  76 y.o. male with a history of CAD s/p CABG 2004, COPD, HTN, and Afib (declined AC) who presented with 1 week of progressive cough, weakness, leg swelling, dyspnea, and orthopnea.  Dr. Thereasa Solo spoke with Dr. Danton Sewer at the [facility] in [place].  Per his record, the only imaging they have on the patient is a CT of the abdomen in March 2018 which did reveal a 7 mm lung nodule at the right base.  Given the size of this lesion the plan was to simply monitor it with repeat imaging.  Imaging to include the hilum however has not been accomplished at the [place].  Dr. Hinda Kehr apparently reported the patient does have COPD with an FEV1/FVC of 32% predicted.  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 Gwenette Greet could perform this as an outpatient at the [facility] in [place] if the patient is otherwise stable.   Peripheral edema Likely due to low albumin and venous insuff - no evidence of signif CHF on TTE - elevated feet - TED hose as able   Mild throactic aortic aneurysm  4.1cm max AP dimension  Elevated troponin Only very mildly elevated - likely related to  RVR - follow clinically - no substernal chest pain  Recent Labs Lab 11/13/16 1828 11/13/16 2258 11/14/16 0552  TROPONINI 0.07* 0.04* 0.06*   Acute kidney injury Nearly resolved w/ volume expansion - follow while on Vanc   Recent Labs Lab 11/13/16 1714 11/14/16 0552 11/15/16 0304 11/16/16 0510 11/17/16 0229  CREATININE 1.46* 1.26* 1.08 0.95 0.83   Chronic atrial fibrillation with acute RVR Chadsvasc is 4 - has previously refused anticoagulation - heart rate better controlled - he has been started on IV heparin infusion per Dr. Terrence Dupont, he has agreed to this and he will have a TEE 7/11 with Dr. Doylene Canard.   Cardiology increased metoprolol to 25 mg BID for rate control, also has IV lopressor ordered as needed.   Possible Cirrhosis  Noted on CT chest - viral hep panel negative - INR normal    Hyperkalemia - treated with oral kayexalate, lasix IV x 1, resolved  Hyponatremia Improved now.  improving w/ volume expansion but considered SIADH given tumor burden  COPD Appears severe on CT chest - followed by Pulm at [facility] - seems compensated at this time   HTN - blood pressures well controlled.   MRSA screen positive  DVT prophylaxis: lovenox  Code Status: DO NOT INTUBATE Family Communication: Spoke with patient and sister at bedside: patient gave me permission to speak with sister about lung mass Disposition Plan: Stable for transfer to telemetry bed  Consultants:  ID Cardiology  Procedures: 7/8 ultrasound-guided R  thoracentesis - 1.8L turbid yellow fluid 7/8 - TTE EF60-65% - no WMA - mild LVH 7/11 - TEE [facility]) pending  Antimicrobials:  Ceftriaxone 7/7 > 7/8 Azithromycin 7/8  Vancomycin 7/8 >  Objective: Blood pressure 104/67, pulse (!) 116, temperature 97.9 F (36.6 C), temperature source Oral, resp. rate 20, height 6\' 3"  (1.905 m), weight 79.8 kg (175 lb 14.8 oz), SpO2 94 %.  Intake/Output Summary (Last 24 hours) at 11/17/16 0734 Last data filed at 11/17/16  5852  Gross per 24 hour  Intake           1300.4 ml  Output              875 ml  Net            425.4 ml   Filed Weights   11/15/16 0400 11/16/16 0500 11/17/16 0451  Weight: 79.7 kg (175 lb 12.8 oz) 81.6 kg (179 lb 14.3 oz) 79.8 kg (175 lb 14.8 oz)   Examination: General: Awake, alert, cooperative, No acute respiratory distress at rest in bed  Lungs: poor air movement throughout right with persistent right basilar crackles - good air movement on left with occasional exp wheezing Cardiovascular: irregularly irregular Abdomen: Nontender, nondistended, soft, bowel sounds positive, no rebound, no ascites, no appreciable mass Extremities: TED Hoses bilateral LEs Neurological: nonfocal.   CBC:  Recent Labs Lab 11/13/16 1714 11/14/16 0552 11/15/16 0304 11/16/16 0510 11/17/16 0229  WBC 17.5* 13.7* 11.2* 10.7* 10.3  NEUTROABS 16.0*  --   --   --   --   HGB 15.6 13.9 13.6 14.8 13.8  HCT 48.2 43.7 44.0 46.5 44.7  MCV 82.1 82.6 84.0 83.8 82.9  PLT 286 238 238 227 778   Basic Metabolic Panel:  Recent Labs Lab 11/13/16 1714 11/13/16 1828 11/13/16 2258 11/14/16 0552 11/15/16 0304 11/16/16 0510 11/17/16 0229  NA 125*  --   --  127* 129* 132* 135  K 5.9* 5.9*  --  5.4* 5.4* 5.6* 4.7  CL 94*  --   --  98* 99* 100* 102  CO2 22  --   --  23 25 25 29   GLUCOSE 117*  --   --  132* 115* 102* 120*  BUN 53*  --   --  53* 49* 34* 27*  CREATININE 1.46*  --   --  1.26* 1.08 0.95 0.83  CALCIUM 8.8*  --   --  8.3* 8.2* 8.3* 8.1*  MG  --   --  2.1  --   --   --  1.8   GFR: Estimated Creatinine Clearance: 84.1 mL/min (by C-G formula based on SCr of 0.83 mg/dL).  Liver Function Tests:  Recent Labs Lab 11/13/16 1714 11/15/16 0304  AST 37 31  ALT 25 23  ALKPHOS 186* 139*  BILITOT 1.0 0.5  PROT 6.9 5.6*  ALBUMIN 2.1* 1.6*    Coagulation Profile:  Recent Labs Lab 11/13/16 1714 11/14/16 1841  INR 1.14 1.15    Cardiac Enzymes:  Recent Labs Lab 11/13/16 1828 11/13/16 2258  11/14/16 0552  TROPONINI 0.07* 0.04* 0.06*    Recent Results (from the past 240 hour(s))  Culture, blood (Routine x 2)     Status: None (Preliminary result)   Collection Time: 11/13/16  5:10 PM  Result Value Ref Range Status   Specimen Description BLOOD RIGHT ANTECUBITAL  Final   Special Requests   Final    BOTTLES DRAWN AEROBIC AND ANAEROBIC Blood Culture adequate volume   Culture NO  GROWTH 3 DAYS  Final   Report Status PENDING  Incomplete  Culture, blood (Routine x 2)     Status: Abnormal (Preliminary result)   Collection Time: 11/13/16  5:25 PM  Result Value Ref Range Status   Specimen Description BLOOD LEFT ANTECUBITAL  Final   Special Requests   Final    BOTTLES DRAWN AEROBIC AND ANAEROBIC Blood Culture adequate volume   Culture  Setup Time   Final    GRAM POSITIVE COCCI IN CLUSTERS ANAEROBIC BOTTLE ONLY CRITICAL RESULT CALLED TO, READ BACK BY AND VERIFIED WITH: [name],PHARMD 11/15/16 1455 [name]    Culture (A)  Final    STAPHYLOCOCCUS AUREUS SUSCEPTIBILITIES TO FOLLOW    Report Status PENDING  Incomplete  Blood Culture ID Panel (Reflexed)     Status: Abnormal   Collection Time: 11/13/16  5:25 PM  Result Value Ref Range Status   Enterococcus species NOT DETECTED NOT DETECTED Final   Listeria monocytogenes NOT DETECTED NOT DETECTED Final   Staphylococcus species DETECTED (A) NOT DETECTED Final    Comment: CRITICAL RESULT CALLED TO, READ BACK BY AND VERIFIED WITH: [name], PHARMD 11/15/16 1455 [name]    Staphylococcus aureus DETECTED (A) NOT DETECTED Final    Comment: Methicillin (oxacillin)-resistant Staphylococcus aureus (MRSA). MRSA is predictably resistant to beta-lactam antibiotics (except ceftaroline). Preferred therapy is vancomycin unless clinically contraindicated. Patient requires contact precautions if  hospitalized. CRITICAL RESULT CALLED TO, READ BACK BY AND VERIFIED WITH: [name], PHARMD 11/15/16 [name]    Methicillin resistance DETECTED (A) NOT  DETECTED Final    Comment: CRITICAL RESULT CALLED TO, READ BACK BY AND VERIFIED WITH: [name],PHARMD 11/15/16 1455 [name]    Streptococcus species NOT DETECTED NOT DETECTED Final   Streptococcus agalactiae NOT DETECTED NOT DETECTED Final   Streptococcus pneumoniae NOT DETECTED NOT DETECTED Final   Streptococcus pyogenes NOT DETECTED NOT DETECTED Final   Acinetobacter baumannii NOT DETECTED NOT DETECTED Final   Enterobacteriaceae species NOT DETECTED NOT DETECTED Final   Enterobacter cloacae complex NOT DETECTED NOT DETECTED Final   Escherichia coli NOT DETECTED NOT DETECTED Final   Klebsiella oxytoca NOT DETECTED NOT DETECTED Final   Klebsiella pneumoniae NOT DETECTED NOT DETECTED Final   Proteus species NOT DETECTED NOT DETECTED Final   Serratia marcescens NOT DETECTED NOT DETECTED Final   Haemophilus influenzae NOT DETECTED NOT DETECTED Final   Neisseria meningitidis NOT DETECTED NOT DETECTED Final   Pseudomonas aeruginosa NOT DETECTED NOT DETECTED Final   Candida albicans NOT DETECTED NOT DETECTED Final   Candida glabrata NOT DETECTED NOT DETECTED Final   Candida krusei NOT DETECTED NOT DETECTED Final   Candida parapsilosis NOT DETECTED NOT DETECTED Final   Candida tropicalis NOT DETECTED NOT DETECTED Final  Urine culture     Status: Abnormal   Collection Time: 11/13/16  5:34 PM  Result Value Ref Range Status   Specimen Description URINE, RANDOM  Final   Special Requests Normal  Final   Culture <10,000 COLONIES/mL INSIGNIFICANT GROWTH (A)  Final   Report Status 11/14/2016 FINAL  Final  MRSA PCR Screening     Status: Abnormal   Collection Time: 11/13/16 11:00 PM  Result Value Ref Range Status   MRSA by PCR POSITIVE (A) NEGATIVE Final    Comment:        The GeneXpert MRSA Assay (FDA approved for NASAL specimens only), is one component of a comprehensive MRSA colonization surveillance program. It is not intended to diagnose MRSA infection nor  to guide or monitor  treatment for MRSA infections. RESULT CALLED TO, READ BACK BY AND VERIFIED WITH: RN KARA MCKIBBEN [id number] @0233  THANEY   Culture, sputum-assessment     Status: None   Collection Time: 11/14/16  1:13 AM  Result Value Ref Range Status   Specimen Description SPUTUM  Final   Special Requests NONE  Final   Sputum evaluation   Final    Sputum specimen not acceptable for testing.  Please recollect.   Gram Stain Report Called to,Read Back By and Verified With: B RONCALO,RN AT 0740 11/14/16 BY L BENFIELD    Report Status 11/14/2016 FINAL  Final  Culture, body fluid-bottle     Status: None (Preliminary result)   Collection Time: 11/14/16  9:50 AM  Result Value Ref Range Status   Specimen Description FLUID RIGHT PLEURAL  Final   Special Requests NONE  Final   Culture NO GROWTH 2 DAYS  Final   Report Status PENDING  Incomplete  Gram stain     Status: None   Collection Time: 11/14/16  9:50 AM  Result Value Ref Range Status   Specimen Description FLUID RIGHT PLEURAL  Final   Special Requests NONE  Final   Gram Stain   Final    ABUNDANT WBC PRESENT, PREDOMINANTLY PMN RARE GRAM POSITIVE COCCI IN CLUSTERS    Report Status 11/14/2016 FINAL  Final     Scheduled Meds: . arformoterol  15 mcg Nebulization BID  . aspirin EC  81 mg Oral Daily  . Chlorhexidine Gluconate Cloth  6 each Topical Q0600  . gabapentin  600 mg Oral TID  . metoprolol tartrate  25 mg Oral BID  . mupirocin ointment  1 application Nasal BID  . nicotine  21 mg Transdermal Daily  . nystatin  5 mL Oral QID  . tamsulosin  0.4 mg Oral BID  . tiotropium  18 mcg Inhalation Daily   Critical Care Time Spent 32 mins   LOS: 4 days   Murvin Natal MD [facility] Office  [phone] Pager - Text Page per Shea Evans as per below:  On-Call/Text Page:      [email or web address]      password [email or web address]  If 7PM-7AM, please contact night-coverage [email or web address] Password [facility] 11/17/2016, 7:34 AM

## 2016-11-17 NOTE — Progress Notes (Signed)
[facility] for Infectious Disease  Date of Admission:  11/13/2016    Total days of antibiotics 4         Day 4 of vancomycin.          ASSESSMENT: MRSA pneumonia complicated with bacteremia and parapneumonic effusion. Mr. Francisco who developed MRSA bacteremia secondary to pneumonia. Clinically appears stable pleural effusion cultures remains negative and repeat blood culture were also negative after 1 day.TTE was negative for any vegetations. He is scheduled for TEE today. Repeat chest x-ray shows some reaccumulation of pleural effusion.  PLAN: 1. Continue vancomycin-duration will be determined after TEE. 2. PICC line placement as repeat blood culture is negative.   Principal Problem:   MRSA bacteremia Active Problems:   Community acquired pneumonia of right lower lobe of lung ([place])   Sepsis ([place])   Pleural effusion, right   Acute on chronic diastolic CHF (congestive heart failure) ([name])   Elevated troponin   AKI (acute kidney injury) ([place])   Hyponatremia   Hyperkalemia   Essential hypertension   AF (paroxysmal atrial fibrillation) ([name])   Coronary artery disease due to lipid rich plaque   COPD (chronic obstructive pulmonary disease) ([name])   Pleural effusion on right   . arformoterol  15 mcg Nebulization BID  . aspirin EC  81 mg Oral Daily  . Chlorhexidine Gluconate Cloth  6 each Topical Q0600  . gabapentin  600 mg Oral TID  . metoprolol tartrate  25 mg Oral BID  . mupirocin ointment  1 application Nasal BID  . nicotine  21 mg Transdermal Daily  . nystatin  5 mL Oral QID  . tamsulosin  0.4 mg Oral BID  . tiotropium  18 mcg Inhalation Daily    SUBJECTIVE: Mr. Kersh was lying comfortably in his bed, waiting for TEE. He denies any shortness of breath or chest pain. He has no other complaints.  Review of Systems: ROS  No Known Allergies  OBJECTIVE: Vitals:   11/17/16 0325 11/17/16 0451 11/17/16 0807 11/17/16 0919  BP: 104/67  113/71 109/70    Pulse: (!) 116  97 (!) 111  Resp: 20  20   Temp: 97.9 F (36.6 C)  (!) 97.3 F (36.3 C)   TempSrc: Oral  Oral   SpO2: 94%  97%   Weight:  175 lb 14.8 oz (79.8 kg)    Height:       Body mass index is 21.99 kg/m.  Physical Exam   Gen. Well-developed elderly man, in no acute distress. Pulmonary. Decreased breath sound at bases bilaterally, more on the right as compared to left. CV. Regular rate and rhythm. Abdomen. Soft, nontender, mildly distended, bowel sounds positive. Extremities. 2+ pitting edema bilaterally, somewhat improved as compared to yesterday.  Lab Results Lab Results  Component Value Date   WBC 10.3 11/17/2016   HGB 13.8 11/17/2016   HCT 44.7 11/17/2016   MCV 82.9 11/17/2016   PLT 210 11/17/2016    Lab Results  Component Value Date   CREATININE 0.83 11/17/2016   BUN 27 (H) 11/17/2016   NA 135 11/17/2016   K 4.7 11/17/2016   CL 102 11/17/2016   CO2 29 11/17/2016    Lab Results  Component Value Date   ALT 23 11/15/2016   AST 31 11/15/2016   ALKPHOS 139 (H) 11/15/2016   BILITOT 0.5 11/15/2016     Microbiology: Recent Results (from the past 240 hour(s))  Culture, blood (Routine x 2)  Status: None (Preliminary result)   Collection Time: 11/13/16  5:10 PM  Result Value Ref Range Status   Specimen Description BLOOD RIGHT ANTECUBITAL  Final   Special Requests   Final    BOTTLES DRAWN AEROBIC AND ANAEROBIC Blood Culture adequate volume   Culture NO GROWTH 4 DAYS  Final   Report Status PENDING  Incomplete  Culture, blood (Routine x 2)     Status: Abnormal   Collection Time: 11/13/16  5:25 PM  Result Value Ref Range Status   Specimen Description BLOOD LEFT ANTECUBITAL  Final   Special Requests   Final    BOTTLES DRAWN AEROBIC AND ANAEROBIC Blood Culture adequate volume   Culture  Setup Time   Final    GRAM POSITIVE COCCI IN CLUSTERS ANAEROBIC BOTTLE ONLY CRITICAL RESULT CALLED TO, READ BACK BY AND VERIFIED WITH: [name],PHARMD 11/15/16 1455 L  CHAMPION    Culture METHICILLIN RESISTANT STAPHYLOCOCCUS AUREUS (A)  Final   Report Status 11/17/2016 FINAL  Final   Organism ID, Bacteria METHICILLIN RESISTANT STAPHYLOCOCCUS AUREUS  Final      Susceptibility   Methicillin resistant staphylococcus aureus - MIC*    CIPROFLOXACIN <=0.5 SENSITIVE Sensitive     ERYTHROMYCIN >=8 RESISTANT Resistant     GENTAMICIN <=0.5 SENSITIVE Sensitive     OXACILLIN >=4 RESISTANT Resistant     TETRACYCLINE <=1 SENSITIVE Sensitive     VANCOMYCIN <=0.5 SENSITIVE Sensitive     TRIMETH/SULFA <=10 SENSITIVE Sensitive     CLINDAMYCIN <=0.25 SENSITIVE Sensitive     RIFAMPIN <=0.5 SENSITIVE Sensitive     Inducible Clindamycin NEGATIVE Sensitive     * METHICILLIN RESISTANT STAPHYLOCOCCUS AUREUS  Blood Culture ID Panel (Reflexed)     Status: Abnormal   Collection Time: 11/13/16  5:25 PM  Result Value Ref Range Status   Enterococcus species NOT DETECTED NOT DETECTED Final   Listeria monocytogenes NOT DETECTED NOT DETECTED Final   Staphylococcus species DETECTED (A) NOT DETECTED Final    Comment: CRITICAL RESULT CALLED TO, READ BACK BY AND VERIFIED WITH: [name], PHARMD 11/15/16 1455 [name]    Staphylococcus aureus DETECTED (A) NOT DETECTED Final    Comment: Methicillin (oxacillin)-resistant Staphylococcus aureus (MRSA). MRSA is predictably resistant to beta-lactam antibiotics (except ceftaroline). Preferred therapy is vancomycin unless clinically contraindicated. Patient requires contact precautions if  hospitalized. CRITICAL RESULT CALLED TO, READ BACK BY AND VERIFIED WITH: [name], PHARMD 11/15/16 [name]    Methicillin resistance DETECTED (A) NOT DETECTED Final    Comment: CRITICAL RESULT CALLED TO, READ BACK BY AND VERIFIED WITH: [name],PHARMD 11/15/16 1455 [name]    Streptococcus species NOT DETECTED NOT DETECTED Final   Streptococcus agalactiae NOT DETECTED NOT DETECTED Final   Streptococcus pneumoniae NOT DETECTED NOT DETECTED Final    Streptococcus pyogenes NOT DETECTED NOT DETECTED Final   Acinetobacter baumannii NOT DETECTED NOT DETECTED Final   Enterobacteriaceae species NOT DETECTED NOT DETECTED Final   Enterobacter cloacae complex NOT DETECTED NOT DETECTED Final   Escherichia coli NOT DETECTED NOT DETECTED Final   Klebsiella oxytoca NOT DETECTED NOT DETECTED Final   Klebsiella pneumoniae NOT DETECTED NOT DETECTED Final   Proteus species NOT DETECTED NOT DETECTED Final   Serratia marcescens NOT DETECTED NOT DETECTED Final   Haemophilus influenzae NOT DETECTED NOT DETECTED Final   Neisseria meningitidis NOT DETECTED NOT DETECTED Final   Pseudomonas aeruginosa NOT DETECTED NOT DETECTED Final   Candida albicans NOT DETECTED NOT DETECTED Final   Candida glabrata NOT DETECTED  NOT DETECTED Final   Candida krusei NOT DETECTED NOT DETECTED Final   Candida parapsilosis NOT DETECTED NOT DETECTED Final   Candida tropicalis NOT DETECTED NOT DETECTED Final  Urine culture     Status: Abnormal   Collection Time: 11/13/16  5:34 PM  Result Value Ref Range Status   Specimen Description URINE, RANDOM  Final   Special Requests Normal  Final   Culture <10,000 COLONIES/mL INSIGNIFICANT GROWTH (A)  Final   Report Status 11/14/2016 FINAL  Final  MRSA PCR Screening     Status: Abnormal   Collection Time: 11/13/16 11:00 PM  Result Value Ref Range Status   MRSA by PCR POSITIVE (A) NEGATIVE Final    Comment:        The GeneXpert MRSA Assay (FDA approved for NASAL specimens only), is one component of a comprehensive MRSA colonization surveillance program. It is not intended to diagnose MRSA infection nor to guide or monitor treatment for MRSA infections. RESULT CALLED TO, READ BACK BY AND VERIFIED WITH: RN KARA MCKIBBEN [id number] @0233  THANEY   Culture, sputum-assessment     Status: None   Collection Time: 11/14/16  1:13 AM  Result Value Ref Range Status   Specimen Description SPUTUM  Final   Special Requests NONE  Final    Sputum evaluation   Final    Sputum specimen not acceptable for testing.  Please recollect.   Gram Stain Report Called to,Read Back By and Verified With: B RONCALO,RN AT 0740 11/14/16 BY L BENFIELD    Report Status 11/14/2016 FINAL  Final  Culture, body fluid-bottle     Status: None (Preliminary result)   Collection Time: 11/14/16  9:50 AM  Result Value Ref Range Status   Specimen Description FLUID RIGHT PLEURAL  Final   Special Requests NONE  Final   Culture NO GROWTH 3 DAYS  Final   Report Status PENDING  Incomplete  Gram stain     Status: None   Collection Time: 11/14/16  9:50 AM  Result Value Ref Range Status   Specimen Description FLUID RIGHT PLEURAL  Final   Special Requests NONE  Final   Gram Stain   Final    ABUNDANT WBC PRESENT, PREDOMINANTLY PMN RARE GRAM POSITIVE COCCI IN CLUSTERS    Report Status 11/14/2016 FINAL  Final  Culture, blood (routine x 2)     Status: None (Preliminary result)   Collection Time: 11/16/16  5:10 AM  Result Value Ref Range Status   Specimen Description BLOOD LEFT ARM  Final   Special Requests IN PEDIATRIC BOTTLE Blood Culture adequate volume  Final   Culture NO GROWTH 1 DAY  Final   Report Status PENDING  Incomplete  Culture, blood (routine x 2)     Status: None (Preliminary result)   Collection Time: 11/16/16  5:12 AM  Result Value Ref Range Status   Specimen Description BLOOD BLOOD LEFT FOREARM  Final   Special Requests IN PEDIATRIC BOTTLE Blood Culture adequate volume  Final   Culture NO GROWTH 1 DAY  Final   Report Status PENDING  Incomplete    Lorella Nimrod, MD [facility] [phone] pager   [phone] cell 11/17/2016, 12:50 PM

## 2016-11-17 NOTE — Progress Notes (Signed)
Physical Therapy Cancellation Note   11/17/16 1337  PT Visit Information  Last PT Received On 11/17/16  Reason Eval/Treat Not Completed Patient at procedure or test/unavailable; Pt off unit for TEE. PT will check on pt later as time allows.   History of Present Illness 76 y.o. male with a past medical history significant for CAD s/p CABG 2004, COPD not on home O2, HTN and Afib declined AC who presents with 1 week progressive cough, weakness, dyspnea, orthopnea.;7/8 s/p thoracentesis removed 1.8L   Earney Navy, PTA Pager: [phone]

## 2016-11-17 NOTE — Progress Notes (Signed)
Subjective:  Feeling better and remains in atrial fibrillation with moderate ventricular response, heart rate better controlled now of increasing Lopressor scheduled for TEE today  Objective:  Vital Signs in the last 24 hours: Temp:  [97.3 F (36.3 C)-98.6 F (37 C)] 97.3 F (36.3 C) (07/11 0807) Pulse Rate:  [28-130] 111 (07/11 0919) Resp:  [20-93] 20 (07/11 0807) BP: (98-113)/(60-90) 109/70 (07/11 0919) SpO2:  [90 %-100 %] 97 % (07/11 0807) Weight:  [79.8 kg (175 lb 14.8 oz)] 79.8 kg (175 lb 14.8 oz) (07/11 0451)  Intake/Output from previous day: 07/10 0701 - 07/11 0700 In: 1300.4 [P.O.:600; I.V.:150.4; IV Piggyback:550] Out: 563 [Urine:875] Intake/Output from this shift: No intake/output data recorded.  Physical Exam: Neck: no adenopathy, no carotid bruit, no JVD and supple, symmetrical, trachea midline Lungs: ecreased breath sounds right lung with bilateral rhonchi Heart: irregularly irregular rhythm, S1, S2 normal and soft systolic murmur noted Abdomen: soft, non-tender; bowel sounds normal; no masses,  no organomegaly Extremities: extremities normal, atraumatic, no cyanosis or edema  Lab Results:  Recent Labs  11/16/16 0510 11/17/16 0229  WBC 10.7* 10.3  HGB 14.8 13.8  PLT 227 210    Recent Labs  11/16/16 0510 11/17/16 0229  NA 132* 135  K 5.6* 4.7  CL 100* 102  CO2 25 29  GLUCOSE 102* 120*  BUN 34* 27*  CREATININE 0.95 0.83   No results for input(s): TROPONINI in the last 72 hours.  Invalid input(s): CK, MB Hepatic Function Panel  Recent Labs  11/15/16 0304  PROT 5.6*  ALBUMIN 1.6*  AST 31  ALT 23  ALKPHOS 139*  BILITOT 0.5   No results for input(s): CHOL in the last 72 hours. No results for input(s): PROTIME in the last 72 hours.  Imaging: Imaging results have been reviewed and Dg Chest 2 View  Result Date: 11/16/2016 CLINICAL DATA:  Pleural effusion.  Pneumonia EXAM: CHEST  2 VIEW COMPARISON:  Two days ago FINDINGS: Mild reaccumulation  of right pleural effusion, but not as extensive as that seen at admission. No apical pneumothorax is seen. By CT there is a large mass at the right base, with asymmetric interstitial thickening. Mediastinal adenopathy. No cardiomegaly. Emphysema. 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 Electronically Signed   By: Monte Fantasia M.D.   On: 11/16/2016 13:09    Cardiac Studies:  Assessment/Plan:  Chronic A. 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

## 2016-11-17 NOTE — Care Management Note (Addendum)
Case Management Note  Patient Details  Name: Mike Becker MRN: [id number] Date of Birth: 07-12-1940  Subjective/Objective:   Pt admitted with SOB, edema, weakness , MRSA bacteremia- pt recently noted to have pulm nodule thought to be Lung CA                Action/Plan:   PTA from home.  Pt is established with the [facility].  CM left voicemail for [employer] at [facility] to inform of admit.  Pt is currently requiring HFNC and may need HH or SNF at discharge - CM will continue to follow for discharge needs   Expected Discharge Date:                  Expected Discharge Plan:     In-House Referral:     Discharge planning Services  CM Consult  Post Acute Care Choice:    Choice offered to:     DME Arranged:    DME Agency:     HH Arranged:    [place] Agency:     Status of Service:     If discussed at [employer], dates discussed:    Additional Comments: 11/17/2016  Pt refusing SNF states he feels as long as he has adequate oxygen -  "I will be fine at home".  CM explained the recommendation for SNF however pt continues to refuse. Pt states he would like to use the [facility] Benefit for [facility] and DME instead of the [facility] benefit.  Pt scheduled for TEE/Cardioversion today - CM will continue to follow for discharge needs.  [place] following pt for IV antibiotics at discharge   11/16/16 SNF recommended -  CSW consulted  CM received call back from [name].  Pt is non service connected- but if he chooses to utilize DME and [place] from [place] he has the benefit, pt can also utilize [employer] [place].  [name], RN 11/17/2016, 1:19 PM

## 2016-11-17 NOTE — CV Procedure (Signed)
INDICATIONS:   The patient is 76 year old male with MRSA bacteremia had atrial fibrillation with RVR, spontaneously converted to sinus rhythm with medication only.  PROCEDURE:  Informed consent was discussed including risks, benefits and alternatives for the procedure.  Risks include, but are not limited to, cough, sore throat, vomiting, nausea, somnolence, esophageal and stomach trauma or perforation, bleeding, low blood pressure, aspiration, pneumonia, infection, trauma to the teeth and death.    Patient was given sedation.  The oropharynx was anesthetized with topical lidocaine.  The transesophageal probe was inserted in the esophagus and stomach and multiple views were obtained.  Agitated saline was used after the transesophageal probe was removed from the body.  The patient was kept under observation until the patient left the procedure room.  The patient left the procedure room in stable condition.   COMPLICATIONS:  There were no immediate complications.  FINDINGS:  1. LEFT VENTRICLE: The left ventricle is normal in structure and function.  Wall motion is normal.  No thrombus or masses seen in the left ventricle.  2. RIGHT VENTRICLE:  The right ventricle is normal in structure and function without any thrombus or masses.    3. LEFT ATRIUM:  The left atrium is normal without any thrombus or masses.  4. LEFT ATRIAL APPENDAGE:  The left atrial appendage is free of any thrombus or masses.  5. RIGHT ATRIUM:  The right atrium is free of any thrombus or masses.    6. ATRIAL SEPTUM:  The atrial septum is normal with PFO detected by sonicated saline injection.  7. MITRAL VALVE:  The mitral valve is normal in structure and function with mild to moderate regurgitation, no masses, stenosis or vegetations.  8. TRICUSPID VALVE:  The tricuspid valve is normal in structure and function without regurgitation, masses, stenosis or vegetations.  9. AORTIC VALVE:  The aortic valve is normal in structure  and function with mild regurgitation, no masses or stenosis. Small mobile vegetations on undersurface of aortic valve or outflow tract area.  10. PULMONIC VALVE:  The pulmonic valve is normal in structure and function without regurgitation, masses, stenosis or vegetations.  11. AORTIC ARCH, ASCENDING AND DESCENDING AORTA:  The aorta had moderate to severe atherosclerosis in the ascending, descending aorta and aortic arch.  12.  Superior Vena Cava : No thrombus or catheter.  13.  Pulmonary Veins: Visible.  14.  Pulmonary artery: visible and normal.   IMPRESSION:   1. Normal LV systolic function. 2. Mild aortic valve vegetation. 3. Positive PFO. 4. Mild to moderate MR. 5. Mild AI.  RECOMMENDATIONS:    Antibiotic treatment for extended period.Marland Kitchen

## 2016-11-18 ENCOUNTER — Inpatient Hospital Stay (HOSPITAL_COMMUNITY): Payer: Medicare PPO

## 2016-11-18 DIAGNOSIS — J189 Pneumonia, unspecified organism: Secondary | ICD-10-CM

## 2016-11-18 DIAGNOSIS — B958 Unspecified staphylococcus as the cause of diseases classified elsewhere: Secondary | ICD-10-CM

## 2016-11-18 DIAGNOSIS — I5033 Acute on chronic diastolic (congestive) heart failure: Secondary | ICD-10-CM

## 2016-11-18 DIAGNOSIS — A419 Sepsis, unspecified organism: Secondary | ICD-10-CM

## 2016-11-18 DIAGNOSIS — I330 Acute and subacute infective endocarditis: Secondary | ICD-10-CM

## 2016-11-18 DIAGNOSIS — R7881 Bacteremia: Secondary | ICD-10-CM

## 2016-11-18 DIAGNOSIS — R748 Abnormal levels of other serum enzymes: Secondary | ICD-10-CM

## 2016-11-18 DIAGNOSIS — I10 Essential (primary) hypertension: Secondary | ICD-10-CM

## 2016-11-18 DIAGNOSIS — J449 Chronic obstructive pulmonary disease, unspecified: Secondary | ICD-10-CM

## 2016-11-18 DIAGNOSIS — N179 Acute kidney failure, unspecified: Secondary | ICD-10-CM

## 2016-11-18 DIAGNOSIS — I480 Paroxysmal atrial fibrillation: Secondary | ICD-10-CM

## 2016-11-18 DIAGNOSIS — J181 Lobar pneumonia, unspecified organism: Secondary | ICD-10-CM

## 2016-11-18 DIAGNOSIS — E871 Hypo-osmolality and hyponatremia: Secondary | ICD-10-CM

## 2016-11-18 LAB — BASIC METABOLIC PANEL
Anion gap: 6 (ref 5–15)
BUN: 22 mg/dL — ABNORMAL HIGH (ref 6–20)
CALCIUM: 7.7 mg/dL — AB (ref 8.9–10.3)
CO2: 26 mmol/L (ref 22–32)
CREATININE: 0.84 mg/dL (ref 0.61–1.24)
Chloride: 100 mmol/L — ABNORMAL LOW (ref 101–111)
GFR calc Af Amer: >60 mL/min (ref >60)
GFR calc non Af Amer: >60 mL/min (ref >60)
Glucose, Bld: 103 mg/dL — ABNORMAL HIGH (ref 65–99)
POTASSIUM: 4.6 mmol/L (ref 3.5–5.1)
Sodium: 132 mmol/L — ABNORMAL LOW (ref 135–145)

## 2016-11-18 LAB — VANCOMYCIN, TROUGH
VANCOMYCIN TR: 19 ug/mL (ref 15–20)
Vancomycin Tr: 22 ug/mL (ref 15–20)

## 2016-11-18 LAB — HEPARIN LEVEL (UNFRACTIONATED): Heparin Unfractionated: 0.33 IU/mL (ref 0.30–0.70)

## 2016-11-18 LAB — CULTURE, BLOOD (ROUTINE X 2)
Culture: NO GROWTH
Special Requests: ADEQUATE

## 2016-11-18 MED ORDER — VANCOMYCIN HCL IN DEXTROSE 750-5 MG/150ML-% IV SOLN
750.0000 mg | Freq: Two times a day (BID) | INTRAVENOUS | Status: DC
  Administered 2016-11-18 – 2016-11-19 (×2): 750 mg via INTRAVENOUS
  Filled 2016-11-18 (×3): qty 150

## 2016-11-18 MED ORDER — AMIODARONE HCL 200 MG PO TABS
200.0000 mg | ORAL_TABLET | Freq: Two times a day (BID) | ORAL | Status: DC
  Administered 2016-11-18 – 2016-11-19 (×3): 200 mg via ORAL
  Filled 2016-11-18 (×3): qty 1

## 2016-11-18 MED ORDER — RESOURCE THICKENUP CLEAR PO POWD
ORAL | Status: DC | PRN
  Filled 2016-11-18: qty 125

## 2016-11-18 NOTE — Progress Notes (Signed)
PHARMACY CONSULT NOTE FOR:  OUTPATIENT  PARENTERAL ANTIBIOTIC THERAPY (OPAT)  Indication: bacteremia Regimen: Vancomycin 1 gram iv Q 12 End date: December 19, 2016  IV antibiotic discharge orders are pended. To discharging provider:  please sign these orders via discharge navigator,  Select New Orders & click on the button choice - Manage This Unsigned Work.     Thank you for allowing pharmacy to be a part of this patient's care.  Tad Moore 11/18/2016, 12:14 PM

## 2016-11-18 NOTE — Progress Notes (Signed)
[place] for heparin Indication: atrial fibrillation  No Known Allergies  Patient Measurements: Height: 6\' 3"  (190.5 cm) Weight: 175 lb 14.8 oz (79.8 kg) IBW/kg (Calculated) : 84.5 Heparin Dosing Weight: 82  Assessment: 76yo male on for AFib with RVR.   Heparin level therapeutic this AM No bleeding noted   Goal of Therapy:  Heparin level 0.3-0.7 units/ml Monitor platelets by anticoagulation protocol: Yes   Plan:  -Continue Heparin gtt at 1650 units/hr -Heparin level  daily wth CBC daily  Thank you Anette Guarneri, PharmD [phone]  11/18/2016 9:05 AM

## 2016-11-18 NOTE — Progress Notes (Signed)
[facility] PROGRESS NOTE  Gurjit Loconte OAC:[id number] DOB: 04/08/1941 DOA: 11/13/2016 PCP: Patient, No Pcp Per     Brief summary  76 y.o.malewith a history of CAD s/p CABG 2004, COPD, HTN, and Afib (declined AC in the past)who presented with 1 week of progressive cough, weakness, leg swelling, dyspnea, and orthopnea.  Dr. Thereasa Solo spoke with Dr. Danton Sewer at the [facility] in [place].  Per his record, the only imaging they have on the patient is a CT of the abdomen in March 2018 which did reveal a 7 mm lung nodule at the right base.  Given the size of this lesion the plan was to simply monitor it with repeat imaging.  Imaging to include the hilum however has not been accomplished at the [place].  Dr. Hinda Kehr apparently reported the patient does have COPD with an FEV1/FVC of 32% predicted. -he is admitted with mrsa pneumonia with bacteremia. Underwent TEE and found to have aortic and mitral valve vegetation. Planned for 6 week of antibiotic treatment    Assessment & Plan:  Sepsis due to MRSA pneumonia  - acute hypoxic respiratory failure on admission. Sepsis physiology has resolved.  - continue antibiotic therapy and supportive care  MRSA bacteremia due to pneumonia. Infective endocarditis. 1 of 2 blood cx + on 11/13/2016. He is on iv antibiotics, improving. repeat blood culture (7/10): NGTD.   -TEE showed aortic and mitral valve vegetations.  ID following and recommended picc with 6 weeks of iv vanc from (7/8>>). 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 8.8x8.1x6.5cm per CT - worrisome for bronchogenic CA - mediastinal and suparclavicular LAD - scattered L pulm nodules c/w mets  -needs bronchoscopy - Dr. Gwenette Greet could perform this as an outpatient at the [facility] in [place] if the patient is otherwise stable. D/w patient  Peripheral edema. 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 Recommended outpatient follow up at discharge   Elevated troponin. Only very mildly elevated - likely related to RVR - follow clinically - no substernal chest pain Acute kidney injury. Nearly resolved w/ volume expansion - follow while on Vanc  Chronic atrial fibrillation with acute RVR. Now in sinus. On amiodarone, cont BB. Chadsvasc is 4 - has previously refused anticoagulation - - he has been started on IV heparin infusion per Dr. Terrence Dupont, he has agreed to this and he  Is willing to cont anticoagulation   Possible Cirrhosis. Noted on CT chest - viral hep panel negative - INR normal   Hyperkalemia - treated with oral kayexalate, lasix IV x 1, resolved Hyponatremia. Improved now.  improving w/ volume expansion but considered SIADH given tumor burden COPD. Appears severe on CT chest - followed by [facility]m at [facility] - seems compensated at this time  HTN - blood pressures well controlled.    DVT prophylaxis: lovenox  Code Status: DO NOT INTUBATE Family Communication: Spoke with patient and sister at bedside: patient gave me permission to speak with sister about lung mass Disposition Plan: Stable for transfer to telemetry bed  Consultants:  ID Cardiology  Procedures: 7/8 ultrasound-guided R thoracentesis - 1.8L turbid yellow fluid 7/8 - TTE EF60-65% - no WMA - mild LVH 7/11 - TEE [facility]) pending  Antimicrobials:  Ceftriaxone 7/7 > 7/8 Azithromycin 7/8  Vancomycin 7/8 >   HPI/Subjective:  Alert. No distress. Reports feeling better. TEE+ veg  Objective: Vitals:   11/18/16 0334 11/18/16 0754  BP: 104/76 126/75  Pulse: (!) 125 (!) 126  Resp: 18 20  Temp: 97.9 F (36.6 C) 97.7 F (36.5 C)    Intake/Output Summary (Last 24 hours) at 11/18/16 0956 Last data filed at 11/18/16 0900  Gross per 24 hour  Intake          1855.66 ml  Output              625 ml  Net          1230.66 ml    Filed Weights   11/17/16 0451 11/17/16 1345 11/18/16 0334  Weight: 79.8 kg (175 lb 14.8 oz) 79.8 kg (175 lb 14.8 oz) 79.8 kg (175 lb 14.8 oz)    Exam:   General:  Alert, no distress   Cardiovascular: s1,s2 rrr  Respiratory: few crackles LL  Abdomen: soft, nt, nd  Musculoskeletal: no edema    Data Reviewed: Basic Metabolic Panel:  Recent Labs Lab 11/13/16 2258 11/14/16 0552 11/15/16 0304 11/16/16 0510 11/17/16 0229 11/18/16 0542  NA  --  127* 129* 132* 135 132*  K  --  5.4* 5.4* 5.6* 4.7 4.6  CL  --  98* 99* 100* 102 100*  CO2  --  23 25 25 29 26   GLUCOSE  --  132* 115* 102* 120* 103*  BUN  --  53* 49* 34* 27* 22*  CREATININE  --  1.26* 1.08 0.95 0.83 0.84  CALCIUM  --  8.3* 8.2* 8.3* 8.1* 7.7*  MG 2.1  --   --   --  1.8  --    Liver Function Tests:  Recent Labs Lab 11/13/16 1714 11/15/16 0304  AST 37 31  ALT 25 23  ALKPHOS 186* 139*  BILITOT 1.0 0.5  PROT 6.9 5.6*  ALBUMIN 2.1* 1.6*   No results for input(s): LIPASE, AMYLASE in the last 168 hours. No results for input(s): AMMONIA in the last 168 hours. CBC:  Recent Labs Lab 11/13/16 1714 11/14/16 0552 11/15/16 0304 11/16/16 0510 11/17/16 0229  WBC 17.5* 13.7* 11.2* 10.7* 10.3  NEUTROABS 16.0*  --   --   --   --   HGB 15.6 13.9 13.6 14.8 13.8  HCT 48.2 43.7 44.0 46.5 44.7  MCV 82.1 82.6 84.0 83.8 82.9  PLT 286 238 238 227 210   Cardiac Enzymes:  Recent Labs Lab 11/13/16 1828 11/13/16 2258 11/14/16 0552  TROPONINI 0.07* 0.04* 0.06*   BNP (last 3 results)  Recent Labs  11/13/16 1714  BNP 193.6*    ProBNP (last 3 results) No results for input(s): PROBNP in the last 8760 hours.  CBG: No results for input(s): GLUCAP in the last 168 hours.  Recent Results (from the past 240 hour(s))  Culture, blood (Routine x 2)     Status: None (Preliminary result)   Collection Time: 11/13/16  5:10 PM  Result Value Ref Range Status   Specimen Description BLOOD RIGHT ANTECUBITAL  Final    Special Requests   Final    BOTTLES DRAWN AEROBIC AND ANAEROBIC Blood Culture adequate volume   Culture NO GROWTH 4 DAYS  Final   Report Status PENDING  Incomplete  Culture, blood (Routine x 2)     Status: Abnormal   Collection Time: 11/13/16  5:25 PM  Result Value Ref Range Status   Specimen Description BLOOD LEFT ANTECUBITAL  Final   Special Requests   Final  BOTTLES DRAWN AEROBIC AND ANAEROBIC Blood Culture adequate volume   Culture  Setup Time   Final    GRAM POSITIVE COCCI IN CLUSTERS ANAEROBIC BOTTLE ONLY CRITICAL RESULT CALLED TO, READ BACK BY AND VERIFIED WITH: [name],PHARMD 11/15/16 1455 [name]    Culture METHICILLIN RESISTANT STAPHYLOCOCCUS AUREUS (A)  Final   Report Status 11/17/2016 FINAL  Final   Organism ID, Bacteria METHICILLIN RESISTANT STAPHYLOCOCCUS AUREUS  Final      Susceptibility   Methicillin resistant staphylococcus aureus - MIC*    CIPROFLOXACIN <=0.5 SENSITIVE Sensitive     ERYTHROMYCIN >=8 RESISTANT Resistant     GENTAMICIN <=0.5 SENSITIVE Sensitive     OXACILLIN >=4 RESISTANT Resistant     TETRACYCLINE <=1 SENSITIVE Sensitive     VANCOMYCIN <=0.5 SENSITIVE Sensitive     TRIMETH/SULFA <=10 SENSITIVE Sensitive     CLINDAMYCIN <=0.25 SENSITIVE Sensitive     RIFAMPIN <=0.5 SENSITIVE Sensitive     Inducible Clindamycin NEGATIVE Sensitive     * METHICILLIN RESISTANT STAPHYLOCOCCUS AUREUS  Blood Culture ID Panel (Reflexed)     Status: Abnormal   Collection Time: 11/13/16  5:25 PM  Result Value Ref Range Status   Enterococcus species NOT DETECTED NOT DETECTED Final   Listeria monocytogenes NOT DETECTED NOT DETECTED Final   Staphylococcus species DETECTED (A) NOT DETECTED Final    Comment: CRITICAL RESULT CALLED TO, READ BACK BY AND VERIFIED WITH: [name], PHARMD 11/15/16 1455 [name]    Staphylococcus aureus DETECTED (A) NOT DETECTED Final    Comment: Methicillin (oxacillin)-resistant Staphylococcus aureus (MRSA). MRSA is predictably resistant to  beta-lactam antibiotics (except ceftaroline). Preferred therapy is vancomycin unless clinically contraindicated. Patient requires contact precautions if  hospitalized. CRITICAL RESULT CALLED TO, READ BACK BY AND VERIFIED WITH: [name], PHARMD 11/15/16 [name]    Methicillin resistance DETECTED (A) NOT DETECTED Final    Comment: CRITICAL RESULT CALLED TO, READ BACK BY AND VERIFIED WITH: [name],PHARMD 11/15/16 1455 [name]    Streptococcus species NOT DETECTED NOT DETECTED Final   Streptococcus agalactiae NOT DETECTED NOT DETECTED Final   Streptococcus pneumoniae NOT DETECTED NOT DETECTED Final   Streptococcus pyogenes NOT DETECTED NOT DETECTED Final   Acinetobacter baumannii NOT DETECTED NOT DETECTED Final   Enterobacteriaceae species NOT DETECTED NOT DETECTED Final   Enterobacter cloacae complex NOT DETECTED NOT DETECTED Final   Escherichia coli NOT DETECTED NOT DETECTED Final   Klebsiella oxytoca NOT DETECTED NOT DETECTED Final   Klebsiella pneumoniae NOT DETECTED NOT DETECTED Final   Proteus species NOT DETECTED NOT DETECTED Final   Serratia marcescens NOT DETECTED NOT DETECTED Final   Haemophilus influenzae NOT DETECTED NOT DETECTED Final   Neisseria meningitidis NOT DETECTED NOT DETECTED Final   Pseudomonas aeruginosa NOT DETECTED NOT DETECTED Final   Candida albicans NOT DETECTED NOT DETECTED Final   Candida glabrata NOT DETECTED NOT DETECTED Final   Candida krusei NOT DETECTED NOT DETECTED Final   Candida parapsilosis NOT DETECTED NOT DETECTED Final   Candida tropicalis NOT DETECTED NOT DETECTED Final  Urine culture     Status: Abnormal   Collection Time: 11/13/16  5:34 PM  Result Value Ref Range Status   Specimen Description URINE, RANDOM  Final   Special Requests Normal  Final   Culture <10,000 COLONIES/mL INSIGNIFICANT GROWTH (A)  Final   Report Status 11/14/2016 FINAL  Final  MRSA PCR Screening     Status: Abnormal   Collection Time: 11/13/16 11:00 PM  Result  Value Ref  Range Status   MRSA by PCR POSITIVE (A) NEGATIVE Final    Comment:        The GeneXpert MRSA Assay (FDA approved for NASAL specimens only), is one component of a comprehensive MRSA colonization surveillance program. It is not intended to diagnose MRSA infection nor to guide or monitor treatment for MRSA infections. RESULT CALLED TO, READ BACK BY AND VERIFIED WITH: RN KARA MCKIBBEN [id number] @0233  THANEY   Culture, sputum-assessment     Status: None   Collection Time: 11/14/16  1:13 AM  Result Value Ref Range Status   Specimen Description SPUTUM  Final   Special Requests NONE  Final   Sputum evaluation   Final    Sputum specimen not acceptable for testing.  Please recollect.   Gram Stain Report Called to,Read Back By and Verified With: B RONCALO,RN AT 0740 11/14/16 BY L BENFIELD    Report Status 11/14/2016 FINAL  Final  Culture, body fluid-bottle     Status: None (Preliminary result)   Collection Time: 11/14/16  9:50 AM  Result Value Ref Range Status   Specimen Description FLUID RIGHT PLEURAL  Final   Special Requests NONE  Final   Culture NO GROWTH 3 DAYS  Final   Report Status PENDING  Incomplete  Gram stain     Status: None   Collection Time: 11/14/16  9:50 AM  Result Value Ref Range Status   Specimen Description FLUID RIGHT PLEURAL  Final   Special Requests NONE  Final   Gram Stain   Final    ABUNDANT WBC PRESENT, PREDOMINANTLY PMN RARE GRAM POSITIVE COCCI IN CLUSTERS    Report Status 11/14/2016 FINAL  Final  Culture, blood (routine x 2)     Status: None (Preliminary result)   Collection Time: 11/16/16  5:10 AM  Result Value Ref Range Status   Specimen Description BLOOD LEFT ARM  Final   Special Requests IN PEDIATRIC BOTTLE Blood Culture adequate volume  Final   Culture NO GROWTH 1 DAY  Final   Report Status PENDING  Incomplete  Culture, blood (routine x 2)     Status: None (Preliminary result)   Collection Time: 11/16/16  5:12 AM  Result Value Ref Range  Status   Specimen Description BLOOD BLOOD LEFT FOREARM  Final   Special Requests IN PEDIATRIC BOTTLE Blood Culture adequate volume  Final   Culture NO GROWTH 1 DAY  Final   Report Status PENDING  Incomplete     Studies: Dg Chest 2 View  Result Date: 11/16/2016 CLINICAL DATA:  Pleural effusion.  Pneumonia EXAM: CHEST  2 VIEW COMPARISON:  Two days ago FINDINGS: Mild reaccumulation of right pleural effusion, but not as extensive as that seen at admission. No apical pneumothorax is seen. By CT there is a large mass at the right base, with asymmetric interstitial thickening. Mediastinal adenopathy. No cardiomegaly. Emphysema. 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 Electronically Signed   By: Monte Fantasia M.D.   On: 11/16/2016 13:09    Scheduled Meds: . amiodarone  200 mg Oral BID  . arformoterol  15 mcg Nebulization BID  . aspirin EC  81 mg Oral Daily  . gabapentin  600 mg Oral TID  . metoprolol tartrate  25 mg Oral BID  . mupirocin ointment  1 application Nasal BID  . nicotine  21 mg Transdermal Daily  . nystatin  5 mL Oral QID  . tamsulosin  0.4 mg Oral  BID  . tiotropium  18 mcg Inhalation Daily   Continuous Infusions: . heparin 1,650 Units/hr (11/17/16 2000)  . 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 If 7PM-7AM, please contact night-coverage at [email or web address], password [facility] 11/18/2016, 9:56 AM  LOS: 5 days

## 2016-11-18 NOTE — Progress Notes (Signed)
OT Cancellation Note  Patient Details Name: Mike Becker MRN: [id number] DOB: Jun 06, 1940   Cancelled Treatment:    Reason Eval/Treat Not Completed: Patient at procedure or test/ unavailable (Leaving for [facility]BS). Will try back.  [name] 11/18/2016, 12:30 PM  [phone]

## 2016-11-18 NOTE — Progress Notes (Signed)
Physical Therapy Treatment Patient Details Name: Mike Becker MRN: [id number] DOB: [birth year] Today's Date: 11/18/2016    History of Present Illness 76 y.o. male with a past medical history significant for CAD s/p CABG 2004, COPD not on home O2, HTN and Afib declined AC who presents with 1 week progressive cough, weakness, dyspnea, orthopnea.;7/8 s/p thoracentesis removed 1.8L    PT Comments    Patient progressing well towards PT goals. Tolerated gait training with Min A for balance/safety. Pushing IV pole for support, not a fan of using a RW. Sp02 dropped to mid 80s on 3L/min 02 but recovers quickly with pursed lip breathing. Pt demonstrates balance deficits and dyspnea on exertion. Continues to recommend SNF. Will follow.   Follow Up Recommendations  SNF;Supervision/Assistance - 24 hour     Equipment Recommendations  Rolling walker with 5" wheels    Recommendations for Other Services       Precautions / Restrictions Precautions Precautions: Fall Precaution Comments: denies h/o falls; endorses recent imbalance due to illness Restrictions Weight Bearing Restrictions: No    Mobility  Bed Mobility Overal bed mobility: Modified Independent                Transfers Overall transfer level: Needs assistance Equipment used: None Transfers: Sit to/from Stand Sit to Stand: Min guard         General transfer comment: Min guard for safety upon standing. Stood from [employer], from toilet x1. reaching for IV pole for support.  Ambulation/Gait Ambulation/Gait assistance: Min assist Ambulation Distance (Feet): 120 Feet Assistive device:  (Pushing IV pole) Gait Pattern/deviations: Step-through pattern;Decreased stride length;Narrow base of support Gait velocity: Decreased   General Gait Details: Slow, unsteady gait pushing IV pole for support; Needs UE support. Most likely will benefit from RW. Does not like it. Sp02 dropped to mid 80s on 3L/min 02.    Stairs             Wheelchair Mobility    Modified Rankin (Stroke Patients Only)       Balance Overall balance assessment: Needs assistance Sitting-balance support: No upper extremity supported;Feet supported Sitting balance-Leahy Scale: Good Sitting balance - Comments: Declines donning socks, total A.   Standing balance support: During functional activity Standing balance-Leahy Scale: Fair Standing balance comment: Can wash hands leaning on sink but requires UE support for dynamic standing.                            Cognition Arousal/Alertness: Awake/alert Behavior During Therapy: WFL for tasks assessed/performed Overall Cognitive Status: Within Functional Limits for tasks assessed                                 General Comments: Impulsive at times.       Exercises      General Comments General comments (skin integrity, edema, etc.): Sister and brother present.       Pertinent Vitals/Pain Pain Assessment: No/denies pain    Home Living                      Prior Function            PT Goals (current goals can now be found in the care plan section) Progress towards PT goals: Progressing toward goals    Frequency    Min 3X/week      PT Plan Current plan  remains appropriate    Co-evaluation              AM-PAC PT "6 Clicks" Daily Activity  Outcome Measure  Difficulty turning over in bed (including adjusting bedclothes, sheets and blankets)?: None Difficulty moving from lying on back to sitting on the side of the bed? : None Difficulty sitting down on and standing up from a chair with arms (e.g., wheelchair, bedside commode, etc,.)?: None Help needed moving to and from a bed to chair (including a wheelchair)?: A Little Help needed walking in hospital room?: A Little Help needed climbing 3-5 steps with a railing? : A Lot 6 Click Score: 20    End of Session Equipment Utilized During Treatment: Oxygen;Gait belt Activity  Tolerance: Patient tolerated treatment well;Treatment limited secondary to medical complications (Comment) (02) Patient left: in bed;with bed alarm set;with call bell/phone within reach;with family/visitor present (sitting EOB) Nurse Communication: Mobility status PT Visit Diagnosis: Unsteadiness on feet (R26.81);Muscle weakness (generalized) (M62.81)     Time: 2297-9892 PT Time Calculation (min) (ACUTE ONLY): 29 min  Charges:  $Gait Training: 8-22 mins $Therapeutic Exercise: 8-22 mins                    G Codes:       Wray Kearns, PT, DPT [phone]     Marguarite Arbour A Denyce Harr 11/18/2016, 2:33 PM

## 2016-11-18 NOTE — Care Management Note (Addendum)
Case Management Note  Patient Details  Name: Mike Becker MRN: [id number] Date of Birth: Jan 10, 1941  Subjective/Objective:   Pt admitted with SOB, edema, weakness , MRSA bacteremia- pt recently noted to have pulm nodule thought to be Lung CA                Action/Plan:   PTA from home.  Pt is established with the [facility].  CM left voicemail for [employer] at [facility] to inform of admit.  Pt is currently requiring HFNC and may need HH or SNF at discharge - CM will continue to follow for discharge needs   Expected Discharge Date:                  Expected Discharge Plan:     In-House Referral:     Discharge planning Services  CM Consult  Post Acute Care Choice:    Choice offered to:     DME Arranged:    DME Agency:     HH Arranged:    [place] Agency:     Status of Service:     If discussed at [employer], dates discussed:    Additional Comments: 11/18/2016  Pt is now again refusing SNF - pt is also refusing all HH other than [facility] if going home on IV antibotics.  [facility] following for [facility] and IV antibiotics - will need actual orders written  Pt states that he now is possibly open to short term discharge at SNF due to current needs - CSW made aware  11/17/16 Pt refusing SNF states he feels as long as he has adequate oxygen -  "I will be fine at home".  CM explained the recommendation for SNF however pt continues to refuse. Pt states he would like to use the [facility] Benefit for [facility] and DME instead of the [facility] benefit.  Pt scheduled for TEE/Cardioversion today - CM will continue to follow for discharge needs.  [facility] following pt for IV antibiotics at discharge   11/16/16 SNF recommended -  CSW consulted  CM received call back from [facility].  Pt is non service connected- but if he chooses to utilize DME and [place] from [place] he has the benefit, pt can also utilize [employer].  [name], RN 11/18/2016, 10:49 AM

## 2016-11-18 NOTE — Evaluation (Signed)
Clinical/Bedside Swallow Evaluation Patient Details  Name: Mike Becker MRN: [id number] Date of Birth: Apr 10, 1941  Today's Date: 11/18/2016 Time: SLP Start Time (ACUTE ONLY): 1055 SLP Stop Time (ACUTE ONLY): 1114 SLP Time Calculation (min) (ACUTE ONLY): 19 min  Past Medical History:  Past Medical History:  Diagnosis Date  . A-fib ([place])   . COPD (chronic obstructive pulmonary disease) ([place])   . Dyspnea   . Dysrhythmia   . Hypertension   . MI (myocardial infarction) [facility])    Past Surgical History:  Past Surgical History:  Procedure Laterality Date  . EXPLORATION POST OPERATIVE OPEN HEART    . TEE WITHOUT CARDIOVERSION N/A 11/17/2016   Procedure: TRANSESOPHAGEAL ECHOCARDIOGRAM (TEE);  Surgeon: Dixie Dials, MD;  Location[facility] ENDOSCOPY;  Service: Cardiovascular;  Laterality: N/A;  . triple bypass     HPI:  76 y.o. male with a past medical history significant for CAD s/p CABG 2004, COPD not on home O2, HTN and Afib declined AC who presents with 1 week progressive cough, weakness, dyspnea, orthopnea.;7/8 s/p thoracentesis removed 1.8L. CXR: right base opacity, rounded and masslike with parapneumonic effusion. Dx with right lung mass   Assessment / Plan / Recommendation Clinical Impression  Patient presents with overt s/s of aspiration across consistencies characterized by either immediate or delayed coughing, lessened with trials of solids and nectar thick liquids. Suspect some degree of delayed swallow initiation. Plan for MBS to more fully assess function and determine least restrictive diet.  SLP Visit Diagnosis: Dysphagia, unspecified (R13.10)       Diet Recommendation NPO        Other  Recommendations Oral Care Recommendations: Oral care QID        Swallow Study   General HPI: 76 y.o. male with a past medical history significant for CAD s/p CABG 2004, COPD not on home O2, HTN and Afib declined AC who presents with 1 week progressive cough, weakness, dyspnea,  orthopnea.;7/8 s/p thoracentesis removed 1.8L. CXR: right base opacity, rounded and masslike with parapneumonic effusion. Dx with right lung mass Type of Study: Bedside Swallow Evaluation Previous Swallow Assessment: none Diet Prior to this Study: Thin liquids (clear liquid) Temperature Spikes Noted: No Respiratory Status: Nasal cannula History of Recent Intubation: No Behavior/Cognition: Alert;Cooperative;Pleasant mood Oral Cavity Assessment: Within Functional Limits Oral Care Completed by SLP: Recent completion by staff Oral Cavity - Dentition: Adequate natural dentition Vision: Functional for self-feeding Self-Feeding Abilities: Able to feed self Patient Positioning: Upright in bed Baseline Vocal Quality: Normal Volitional Cough: Congested;Strong Volitional Swallow: Able to elicit    Oral/Motor/Sensory Function Overall Oral Motor/Sensory Function: Within functional limits   Ice Chips Ice chips: Not tested   Thin Liquid Thin Liquid: Impaired Presentation: Cup;Self Fed Pharyngeal  Phase Impairments: Multiple swallows;Cough - Immediate    Nectar Thick Nectar Thick Liquid: Impaired Presentation: Cup;Self Fed Pharyngeal Phase Impairments: Cough - Immediate;Cough - Delayed   Honey Thick Honey Thick Liquid: Not tested   Puree Puree: Within functional limits Presentation: Self Fed;Spoon   Solid   GO   Solid: Impaired Presentation: Self Fed Pharyngeal Phase Impairments: Cough - Delayed       Cord Wilczynski MA, CCC-SLP ([phone]  Retia Cordle Meryl 11/18/2016,2:58 PM

## 2016-11-18 NOTE — Progress Notes (Signed)
Subjective:  Patient denies any chest pain states breathing is improving slowly results of TEE noted  Objective:  Vital Signs in the last 24 hours: Temp:  [97.6 F (36.4 C)-98.2 F (36.8 C)] 97.7 F (36.5 C) (07/12 0754) Pulse Rate:  [25-129] 126 (07/12 0754) Resp:  [18-30] 20 (07/12 0754) BP: (79-137)/(25-105) 126/75 (07/12 0754) SpO2:  [88 %-100 %] 100 % (07/12 0753) Weight:  [79.8 kg (175 lb 14.8 oz)] 79.8 kg (175 lb 14.8 oz) (07/12 0334)  Intake/Output from previous day: 07/11 0701 - 07/12 0700 In: 1577.7 [P.O.:950; I.V.:427.7; IV Piggyback:200] Out: 625 [Urine:625] Intake/Output from this shift: Total I/O In: 306 [P.O.:240; I.V.:66] Out: -   Physical Exam: Neck: no adenopathy, no carotid bruit, no JVD and supple, symmetrical, trachea midline Lungs: Decreased breath sound at right base with occasional rhonchi Heart: regular rate and rhythm, S1, S2 normal and Soft systolic murmur noted Abdomen: soft, non-tender; bowel sounds normal; no masses,  no organomegaly Extremities: extremities normal, atraumatic, no cyanosis or edema  Lab Results:  Recent Labs  11/16/16 0510 11/17/16 0229  WBC 10.7* 10.3  HGB 14.8 13.8  PLT 227 210    Recent Labs  11/17/16 0229 11/18/16 0542  NA 135 132*  K 4.7 4.6  CL 102 100*  CO2 29 26  GLUCOSE 120* 103*  BUN 27* 22*  CREATININE 0.83 0.84   No results for input(s): TROPONINI in the last 72 hours.  Invalid input(s): CK, MB Hepatic Function Panel No results for input(s): PROT, ALBUMIN, AST, ALT, ALKPHOS, BILITOT, BILIDIR, IBILI in the last 72 hours. No results for input(s): CHOL in the last 72 hours. No results for input(s): PROTIME in the last 72 hours.  Imaging: Imaging results have been reviewed and Dg Chest 2 View  Result Date: 11/16/2016 CLINICAL DATA:  Pleural effusion.  Pneumonia EXAM: CHEST  2 VIEW COMPARISON:  Two days ago FINDINGS: Mild reaccumulation of right pleural effusion, but not as extensive as that seen at  admission. No apical pneumothorax is seen. By CT there is a large mass at the right base, with asymmetric interstitial thickening. Mediastinal adenopathy. No cardiomegaly. Emphysema. 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 Electronically Signed   By: Monte Fantasia M.D.   On: 11/16/2016 13:09    Cardiac Studies:  Assessment/Plan:  Paroxysmal A. fib with RVR chadsvasc score of 4 Right lung pneumonia with parapneumonic effusion Possible aortic valve endocarditis Right lung mass Moderate aortic regurgitation MRSA bacteremia probably secondary to above rule out endocarditis Coronary artery disease history of MI in the past status post CABG 3 in 2004 in [place] Hypertension History of diastolic congestive heart failure COPD on home O2 Plan Add amiodarone as per orders  LOS: 5 days    Charolette Forward 11/18/2016, 9:39 AM

## 2016-11-18 NOTE — Progress Notes (Signed)
Pt has arrived to the floor. No distress noted. Skin tear to left upper arm covered with foam,generalized ecchymosis noted.  3L O2 via [place], diminished breath sounds noted.Will continue to monitor.

## 2016-11-18 NOTE — Progress Notes (Signed)
Pharmacy Antibiotic Note Mike Becker is a 76 y.o. male admitted on 11/13/2016 with worsening SOB. Currently with MRSA bacteremia. Pharmacy following for vancomycin.   SCr normalized since admission. Repeat blood cultures have been negative. No fevers noted, wbc normal yesterday.  Vancomycin trough this afternoon is slightly elevated at 22 drawn appropriately  Plan: 1. Adjust vancomycin 750 gram IV every 12 hours  2. Obtain vancomycin trough at SS; goal trough 15-20 3. SCr every 72 hours while on vancomycin   Height: 6\' 3"  (190.5 cm) Weight: 175 lb 14.8 oz (79.8 kg) IBW/kg (Calculated) : 84.5  Temp (24hrs), Avg:98 F (36.7 C), Min:97.6 F (36.4 C), Max:99 F (37.2 C)   Recent Labs Lab 11/13/16 1714 11/13/16 1729 11/13/16 1944 11/14/16 0552 11/15/16 0304 11/16/16 0510 11/17/16 0229 11/18/16 0542 11/18/16 1618  WBC 17.5*  --   --  13.7* 11.2* 10.7* 10.3  --   --   CREATININE 1.46*  --   --  1.26* 1.08 0.95 0.83 0.84  --   LATICACIDVEN  --  2.10* 1.86  --   --   --   --   --   --   VANCOTROUGH  --   --   --   --   --   --   --  19 22*    Estimated Creatinine Clearance: 83.1 mL/min (by C-G formula based on SCr of 0.84 mg/dL).    No Known Allergies  Antimicrobials this admission: 7/8 Ceftriaxone >> 7/9 7/8 Azithromycin >>7/9  7/8 vancomycin >>   Microbiology results: 7/7 BCx:  MRSA  7/7 UCx: ngF 7/7 strep pna: negative  7/7 MRSA PCR: positive  7/8: pleural fluid:  rare GPC (gram stain) 7/10 BCx: ngtd  Thank you for allowing pharmacy to be a part of this patient's care.  Erin Hearing PharmD., BCPS Clinical Pharmacist Pager [phone] 11/18/2016 6:25 PM

## 2016-11-18 NOTE — Progress Notes (Signed)
Critical vanc trough of 22. Dr. Daleen Bo informed. Waiting on response.

## 2016-11-18 NOTE — Progress Notes (Signed)
[id number] states that pharmacy will adjust vancomycin for pt. Will continue to monitor.

## 2016-11-18 NOTE — Progress Notes (Signed)
Modified Barium Swallow Progress Note  Patient Details  Name: Mike Becker MRN: [id number] Date of Birth: 11-01-1940  Today's Date: 11/18/2016  Modified Barium Swallow completed.  Full report located under Chart Review in the Imaging Section.  Brief recommendations include the following:  Clinical Impression  Patient presents with a moderate pharyngeal phase dysphagia characterized by combination of delayed in swallow initiation and decreased laryngeal closure resulting in penetration before and/or during the swallow. Penetration of nectar thick liquids and pureed solid shallow and cleared quickly with combination of spontaneous dry swallow and cued cough. Penetration of thin liquids however deep and does not consistently clear significantly increasing risk of aspiration. Risk also increases due to intermittent episodes of post swallow penetration due to presence of vallecular and pyriform sinus residue.  Highly suspect some degree of chronic dysphagia with h/o COPD and dyspnea, exacerbated by acute condition. Recommend downgrading diet to mitigate risk of aspiration with hopeful ability to be able to advance with use of compensatory strategies as functonal reserve for ability to tolerate aspiration improves.    Swallow Evaluation Recommendations       SLP Diet Recommendations: Dysphagia 3 (Mech soft) solids;Nectar thick liquid   Liquid Administration via: Cup;No straw   Medication Administration: Whole meds with puree   Supervision: Patient able to self feed;Intermittent supervision to cue for compensatory strategies   Compensations: Slow rate;Small sips/bites;Clear throat intermittently   Postural Changes: Seated upright at 90 degrees   Oral Care Recommendations: Oral care BID   Other Recommendations: Order thickener from pharmacy;Prohibited food (jello, ice cream, thin soups);Remove water pitcher  Gabriel Rainwater MA, CCC-SLP [phone]   [name] Meryl 11/18/2016,3:14  PM

## 2016-11-18 NOTE — Progress Notes (Signed)
Mike Becker for Infectious Disease  Date of Admission:  11/13/2016    Total days of antibiotics 5         Day 5 of Vancomycin         ASSESSMENT: MRSA bacteremia secondary to pneumonia complicated with endocarditis and parapneumonic effusion. Mike Becker who developed MRSA bacteremia secondary to pneumonia, now found to have multiple vegetations on the aortic, mitral and tricuspid valves. He will need 6 weeks of antibiotics. His repeat blood cultures remained negative and PICC line was ordered yesterday, it was not placed yet.  PLAN: 1. Continue vancomycin for 6 weeks. End date. 12/25/16.  Diagnosis: MRSA bacteremia complicated with endocarditis and parapneumonic effusion.  Culture Result: MRSA  No Known Allergies  OPAT Orders Discharge antibiotics: Vancomycin Per pharmacy protocol . Aim for Vancomycin trough 15-20 (unless otherwise indicated) Duration: 6 weeks End Date: 12/25/16.  PICC Care Per Protocol:  Labs weekly while on IV antibiotics: _x_ CBC with differential _x_ BMP __ CMP _x_ CRP _x_ ESR _x_ Vancomycin trough  __ Please pull PIC at completion of IV antibiotics _x_ Please leave PIC in place until doctor has seen patient or been notified  Fax weekly labs to [phone]  Clinic Follow Up Appt: Follow-up with Dr. Megan Salon in 4 weeks.   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 amiodarone  200 mg Oral BID  . arformoterol  15  mcg Nebulization BID  . aspirin EC  81 mg Oral Daily  . gabapentin  600 mg Oral TID  . metoprolol tartrate  25 mg Oral BID  . mupirocin ointment  1 application Nasal BID  . nicotine  21 mg Transdermal Daily  . nystatin  5 mL Oral QID  . tamsulosin  0.4 mg Oral BID  . tiotropium  18 mcg Inhalation Daily    SUBJECTIVE: Patient was feeling better, still having productive cough of yellowish sputum. He denies any chest pain or worsening of shortness of breath. He wants to follow-up with outpatient ID clinic, he will go to [place] to get a biopsy of his lung mass.  Review of Systems: ROS  No Known Allergies  OBJECTIVE: Vitals:   11/18/16 0300 11/18/16 0334 11/18/16 0753 11/18/16 0754  BP: 117/78 104/76  126/75  Pulse: (!) 127 (!) 125  (!) 126  Resp: (!) _0 Temp:  97.9 F (36.6 C)  97.7 F (36.5 C)  TempSrc:  Oral  Oral  SpO2: 97% 96% 100%   Weight:  175 lb 14.8 oz (79.8 kg)    Height:       Body mass index is 21.99 kg/m.  Physical Exam  Gen. Well-developed elderly man, sitting comfortably on the side of his bed, in no acute distress. Pulmonary. Decreased breath sound at bases bilaterally. CV. Irregularly irregular with tachycardia. Abdomen. Soft, nontender, mildly distended, bowel sounds positive. Extremities. 2+ pitting edema bilaterally. Pulses intact and symmetrical.  Lab Results Lab Results  Component Value Date   WBC 10.3 11/17/2016   HGB 13.8 11/17/2016   HCT 44.7 11/17/2016   MCV 82.9 11/17/2016   PLT 210 11/17/2016    Lab Results  Component Value Date   CREATININE 0.84 11/18/2016   BUN 22 (H) 11/18/2016   NA 132 (L) 11/18/2016   K 4.6 11/18/2016   CL 100 (L) 11/18/2016   CO2 26 11/18/2016    Lab Results  Component Value Date   ALT 23 11/15/2016   AST 31 11/15/2016   ALKPHOS 139 (H) 11/15/2016   BILITOT 0.5 11/15/2016     Microbiology: Recent Results (from the past 240 hour(s))  Culture, blood (Routine x 2)     Status: None (Preliminary  result)   Collection Time: 11/13/16  5:10 PM  Result Value Ref Range Status   Specimen Description BLOOD RIGHT ANTECUBITAL  Final   Special Requests   Final    BOTTLES DRAWN AEROBIC AND ANAEROBIC Blood Culture adequate volume   Culture NO GROWTH 4 DAYS  Final   Report Status PENDING  Incomplete  Culture, blood (Routine x 2)     Status: Abnormal   Collection Time: 11/13/16  5:25 PM  Result Value Ref Range Status   Specimen Description BLOOD LEFT ANTECUBITAL  Final   Special Requests   Final    BOTTLES DRAWN AEROBIC AND ANAEROBIC Blood Culture adequate volume   Culture  Setup Time   Final    GRAM POSITIVE COCCI IN CLUSTERS ANAEROBIC BOTTLE ONLY CRITICAL RESULT CALLED TO, READ BACK BY AND VERIFIED WITH: [name],PHARMD 11/15/16 1455 [name]    Culture METHICILLIN RESISTANT STAPHYLOCOCCUS AUREUS (A)  Final   Report Status 11/17/2016 FINAL  Final   Organism ID, Bacteria METHICILLIN RESISTANT STAPHYLOCOCCUS AUREUS  Final      Susceptibility   Methicillin resistant staphylococcus aureus - MIC*    CIPROFLOXACIN <=0.5 SENSITIVE Sensitive     ERYTHROMYCIN >=8 RESISTANT Resistant     GENTAMICIN <=0.5 SENSITIVE Sensitive     OXACILLIN >=4 RESISTANT Resistant     TETRACYCLINE <=1 SENSITIVE Sensitive     VANCOMYCIN <=0.5 SENSITIVE Sensitive     TRIMETH/SULFA <=10 SENSITIVE Sensitive     CLINDAMYCIN <=0.25 SENSITIVE Sensitive     RIFAMPIN <=0.5 SENSITIVE Sensitive     Inducible Clindamycin NEGATIVE Sensitive     * METHICILLIN RESISTANT STAPHYLOCOCCUS AUREUS  Blood Culture ID Panel (Reflexed)     Status: Abnormal   Collection Time: 11/13/16  5:25 PM  Result Value Ref Range Status   Enterococcus species NOT DETECTED NOT DETECTED Final   Listeria monocytogenes NOT DETECTED NOT DETECTED Final   Staphylococcus species DETECTED (A) NOT DETECTED Final    Comment: CRITICAL RESULT CALLED TO, READ BACK BY AND VERIFIED WITH: [name], PHARMD 11/15/16 1455 [name]    Staphylococcus aureus DETECTED  (A) NOT DETECTED Final    Comment: Methicillin (oxacillin)-resistant Staphylococcus aureus (MRSA). MRSA is predictably resistant to beta-lactam antibiotics (except ceftaroline). Preferred therapy is vancomycin unless clinically contraindicated. Patient requires contact precautions if  hospitalized. CRITICAL RESULT CALLED TO, READ BACK BY AND VERIFIED WITH: [name], PHARMD 11/15/16 [name]    Methicillin resistance DETECTED (A) NOT DETECTED Final    Comment: CRITICAL RESULT CALLED TO, READ BACK BY AND VERIFIED WITH: [name],PHARMD 11/15/16 1455 [name]    Streptococcus species NOT DETECTED NOT DETECTED Final   Streptococcus agalactiae NOT DETECTED NOT DETECTED Final   Streptococcus pneumoniae NOT DETECTED  NOT DETECTED Final   Streptococcus pyogenes NOT DETECTED NOT DETECTED Final   Acinetobacter baumannii NOT DETECTED NOT DETECTED Final   Enterobacteriaceae species NOT DETECTED NOT DETECTED Final   Enterobacter cloacae complex NOT DETECTED NOT DETECTED Final   Escherichia coli NOT DETECTED NOT DETECTED Final   Klebsiella oxytoca NOT DETECTED NOT DETECTED Final   Klebsiella pneumoniae NOT DETECTED NOT DETECTED Final   Proteus species NOT DETECTED NOT DETECTED Final   Serratia marcescens NOT DETECTED NOT DETECTED Final   Haemophilus influenzae NOT DETECTED NOT DETECTED Final   Neisseria meningitidis NOT DETECTED NOT DETECTED Final   Pseudomonas aeruginosa NOT DETECTED NOT DETECTED Final   Candida albicans NOT DETECTED NOT DETECTED Final   Candida glabrata NOT DETECTED NOT DETECTED Final   Candida krusei NOT DETECTED NOT DETECTED Final   Candida parapsilosis NOT DETECTED NOT DETECTED Final   Candida tropicalis NOT DETECTED NOT DETECTED Final  Urine culture     Status: Abnormal   Collection Time: 11/13/16  5:34 PM  Result Value Ref Range Status   Specimen Description URINE, RANDOM  Final   Special Requests Normal  Final   Culture <10,000 COLONIES/mL INSIGNIFICANT GROWTH (A)  Final     Report Status 11/14/2016 FINAL  Final  MRSA PCR Screening     Status: Abnormal   Collection Time: 11/13/16 11:00 PM  Result Value Ref Range Status   MRSA by PCR POSITIVE (A) NEGATIVE Final    Comment:        The GeneXpert MRSA Assay (FDA approved for NASAL specimens only), is one component of a comprehensive MRSA colonization surveillance program. It is not intended to diagnose MRSA infection nor to guide or monitor treatment for MRSA infections. RESULT CALLED TO, READ BACK BY AND VERIFIED WITH: RN KARA MCKIBBEN [id number] _[id number]  THANEY   Culture, sputum-assessment     Status: None   Collection Time: 11/14/16  1:13 AM  Result Value Ref Range Status   Specimen Description SPUTUM  Final   Special Requests NONE  Final   Sputum evaluation   Final    Sputum specimen not acceptable for testing.  Please recollect.   Gram Stain Report Called to,Read Back By and Verified With: B RONCALO,RN AT 0740 11/14/16 BY L BENFIELD    Report Status 11/14/2016 FINAL  Final  Culture, body fluid-bottle     Status: None (Preliminary result)   Collection Time: 11/14/16  9:50 AM  Result Value Ref Range Status   Specimen Description FLUID RIGHT PLEURAL  Final   Special Requests NONE  Final   Culture NO GROWTH 3 DAYS  Final   Report Status PENDING  Incomplete  Gram stain     Status: None   Collection Time: 11/14/16  9:50 AM  Result Value Ref Range Status   Specimen Description FLUID RIGHT PLEURAL  Final   Special Requests NONE  Final   Gram Stain   Final    ABUNDANT WBC PRESENT, PREDOMINANTLY PMN RARE GRAM POSITIVE COCCI IN CLUSTERS    Report Status 11/14/2016 FINAL  Final  Culture, blood (routine x 2)     Status: None (Preliminary result)   Collection Time: 11/16/16  5:10 AM  Result Value Ref Range Status   Specimen Description BLOOD LEFT ARM  Final   Special Requests IN PEDIATRIC BOTTLE Blood Culture adequate volume  Final   Culture NO GROWTH 1 DAY  Final   Report Status PENDING  Incomplete   Culture, blood (routine x 2)  Status: None (Preliminary result)   Collection Time: 11/16/16  5:12 AM  Result Value Ref Range Status   Specimen Description BLOOD BLOOD LEFT FOREARM  Final   Special Requests IN PEDIATRIC BOTTLE Blood Culture adequate volume  Final   Culture NO GROWTH 1 DAY  Final   Report Status PENDING  Incomplete    Lorella Nimrod, MD [place] [phone] pager   [phone] cell 11/18/2016, 10:11 AM

## 2016-11-19 LAB — BASIC METABOLIC PANEL
ANION GAP: 4 — AB (ref 5–15)
BUN: 19 mg/dL (ref 6–20)
CO2: 30 mmol/L (ref 22–32)
Calcium: 8.1 mg/dL — ABNORMAL LOW (ref 8.9–10.3)
Chloride: 96 mmol/L — ABNORMAL LOW (ref 101–111)
Creatinine, Ser: 0.91 mg/dL (ref 0.61–1.24)
GLUCOSE: 102 mg/dL — AB (ref 65–99)
POTASSIUM: 4.8 mmol/L (ref 3.5–5.1)
Sodium: 130 mmol/L — ABNORMAL LOW (ref 135–145)

## 2016-11-19 LAB — CBC
HEMATOCRIT: 42.7 % (ref 39.0–52.0)
HEMOGLOBIN: 13.4 g/dL (ref 13.0–17.0)
MCH: 26 pg (ref 26.0–34.0)
MCHC: 31.4 g/dL (ref 30.0–36.0)
MCV: 82.9 fL (ref 78.0–100.0)
Platelets: 218 10*3/uL (ref 150–400)
RBC: 5.15 MIL/uL (ref 4.22–5.81)
RDW: 15.1 % (ref 11.5–15.5)
WBC: 11.1 10*3/uL — ABNORMAL HIGH (ref 4.0–10.5)

## 2016-11-19 LAB — HEPARIN LEVEL (UNFRACTIONATED): HEPARIN UNFRACTIONATED: 0.49 [IU]/mL (ref 0.30–0.70)

## 2016-11-19 LAB — CULTURE, BODY FLUID W GRAM STAIN -BOTTLE

## 2016-11-19 LAB — CULTURE, BODY FLUID-BOTTLE: CULTURE: NO GROWTH

## 2016-11-19 MED ORDER — METOPROLOL TARTRATE 25 MG PO TABS: 25.0000 mg | ORAL_TABLET | Freq: Two times a day (BID) | ORAL | 0 refills | Status: DC

## 2016-11-19 MED ORDER — VANCOMYCIN HCL IN DEXTROSE 750-5 MG/150ML-% IV SOLN: 750.0000 mg | Freq: Two times a day (BID) | INTRAVENOUS | 0 refills | Status: AC

## 2016-11-19 MED ORDER — TIOTROPIUM BROMIDE-OLODATEROL 2.5-2.5 MCG/ACT IN AERS: 2.0000 | INHALATION_SPRAY | Freq: Every day | RESPIRATORY_TRACT | 0 refills | Status: AC

## 2016-11-19 MED ORDER — LEVALBUTEROL HCL 0.63 MG/3ML IN NEBU: 0.6300 mg | INHALATION_SOLUTION | Freq: Four times a day (QID) | RESPIRATORY_TRACT | 12 refills | Status: AC | PRN

## 2016-11-19 MED ORDER — AMIODARONE HCL 200 MG PO TABS: 200.0000 mg | ORAL_TABLET | Freq: Every day | ORAL | 0 refills | Status: DC

## 2016-11-19 MED ORDER — TAMSULOSIN HCL 0.4 MG PO CAPS: 0.4000 mg | ORAL_CAPSULE | Freq: Every day | ORAL | 0 refills | Status: AC

## 2016-11-19 MED ORDER — METOPROLOL TARTRATE 25 MG PO TABS: 25.0000 mg | ORAL_TABLET | Freq: Two times a day (BID) | ORAL | 0 refills | Status: AC

## 2016-11-19 MED ORDER — RIVAROXABAN (XARELTO) EDUCATION KIT FOR AFIB PATIENTS
PACK | Freq: Once | Status: AC
  Administered 2016-11-19: 13:00:00
  Filled 2016-11-19: qty 1

## 2016-11-19 MED ORDER — RIVAROXABAN 20 MG PO TABS: 20.0000 mg | ORAL_TABLET | Freq: Every day | ORAL | 0 refills | Status: DC

## 2016-11-19 MED ORDER — RIVAROXABAN 20 MG PO TABS: 20.0000 mg | ORAL_TABLET | Freq: Every day | ORAL | 0 refills | Status: AC

## 2016-11-19 MED ORDER — MOMETASONE FUROATE 220 MCG/INH IN AEPB: 2.0000 | INHALATION_SPRAY | Freq: Every day | RESPIRATORY_TRACT | 12 refills | Status: AC

## 2016-11-19 MED ORDER — ACETAMINOPHEN 325 MG PO TABS: 650.0000 mg | ORAL_TABLET | Freq: Four times a day (QID) | ORAL | Status: DC | PRN

## 2016-11-19 MED ORDER — GABAPENTIN 600 MG PO TABS: 600.0000 mg | ORAL_TABLET | Freq: Three times a day (TID) | ORAL | 0 refills | Status: AC

## 2016-11-19 MED ORDER — VANCOMYCIN HCL IN DEXTROSE 750-5 MG/150ML-% IV SOLN: 750.0000 mg | Freq: Two times a day (BID) | INTRAVENOUS | 0 refills | Status: DC

## 2016-11-19 MED ORDER — ACETAMINOPHEN 325 MG PO TABS: 650.0000 mg | ORAL_TABLET | Freq: Four times a day (QID) | ORAL | Status: AC | PRN

## 2016-11-19 MED ORDER — HEPARIN (PORCINE) IN NACL 100-0.45 UNIT/ML-% IJ SOLN: 1650.0000 [IU]/h | INTRAMUSCULAR | Status: AC

## 2016-11-19 MED ORDER — RIVAROXABAN 20 MG PO TABS: 20.0000 mg | ORAL_TABLET | Freq: Every day | ORAL | Status: DC

## 2016-11-19 MED ORDER — SODIUM CHLORIDE 0.9% FLUSH: 10.0000 mL | INTRAVENOUS | Status: DC | PRN

## 2016-11-19 MED ORDER — SODIUM CHLORIDE 0.9% FLUSH
10.0000 mL | Freq: Two times a day (BID) | INTRAVENOUS | Status: DC
  Administered 2016-11-19: 10 mL

## 2016-11-19 MED ORDER — RIVAROXABAN 20 MG PO TABS
20.0000 mg | ORAL_TABLET | Freq: Every day | ORAL | Status: DC
  Administered 2016-11-19: 20 mg via ORAL
  Filled 2016-11-19: qty 1

## 2016-11-19 MED ORDER — TAMSULOSIN HCL 0.4 MG PO CAPS: 0.4000 mg | ORAL_CAPSULE | Freq: Every day | ORAL | Status: DC

## 2016-11-19 MED ORDER — LEVALBUTEROL HCL 0.63 MG/3ML IN NEBU: 0.6300 mg | INHALATION_SOLUTION | Freq: Four times a day (QID) | RESPIRATORY_TRACT | 12 refills | Status: DC | PRN

## 2016-11-19 MED ORDER — AMIODARONE HCL 200 MG PO TABS: 200.0000 mg | ORAL_TABLET | Freq: Every day | ORAL | 0 refills | Status: AC

## 2016-11-19 NOTE — Progress Notes (Signed)
Speech Language Pathology Dysphagia Treatment Patient Details Name: Mike Becker MRN: [id number] DOB: 1940/05/26 Today's Date: 11/19/2016 Time: 1350-1420 SLP Time Calculation (min) (ACUTE ONLY): 30 min  Assessment / Plan / Recommendation Clinical Impression   Treatment focused on diet tolerance and initiation of RMT to facilitate improved laryngeal/pharyngeal musculature and strength of cough for airway protection. Manometry complete with patient falling below normal ranges for both inspiratory and expiratory muscle strength. Provided patient with EMST 150 (set to 18) and [employer] IMT (set to 33). Patient able to return demonstration of device use for 5 repetitions, reporting a 7/10 effort which is within ideal range. Educated patient and sister regarding benefits of devices and instructions for completion, 5 set of 5 once per day and f/u with SLP at SNF. Self fed prescribed diet without overt s/s of aspiration and with min verbal cueing for slowed rate of intake and small bites/sips. Overall current diet appears appropriate. Hopeful for ability to return to thin liquids, likely with use of aspiration precautions/compensatory strategies, with improved conditioning. Highly recommend OP MBS to determine potential to advance when deemed appropriate by f/u SLP. Will continue to f/u if patient does not d/c today.     Diet Recommendation    Dysphagia 3, nectar thick liquid   SLP Plan Continue with current plan of care      Swallowing Goals     General Behavior/Cognition: Alert;Cooperative;Pleasant mood Patient Positioning: Upright in chair/Tumbleform Oral care provided: N/A HPI: 76 y.o. male with a past medical history significant for CAD s/p CABG 2004, uvulectomy, COPD not on home O2, HTN and Afib declined AC who presents with 1 week progressive cough, weakness, dyspnea, orthopnea.;7/8 s/p thoracentesis removed 1.8L. CXR: right base opacity, rounded and masslike with parapneumonic  effusion. Dx with right lung mass  Oral Cavity - Oral Hygiene     Dysphagia Treatment Family/Caregiver Educated: sister Treatment Methods: Skilled observation;Patient/caregiver education;Therapeutic exercise Patient observed directly with PO's: Yes Type of PO's observed: Regular;Nectar-thick liquids Feeding: Able to feed self Liquids provided via: Cup Type of cueing: Verbal Amount of cueing: Minimal   GO   Mike Rainwater MA, CCC-SLP [phone]   Blanton Becker Mike 11/19/2016, 4:24 PM

## 2016-11-19 NOTE — Discharge Instructions (Signed)
Information on my medicine - XARELTO (Rivaroxaban)  This medication education was reviewed with me or my healthcare representative as part of my discharge preparation.  The pharmacist that spoke with me during my hospital stay was:  Brain Hilts, [facility]  Why was Xarelto prescribed for you? Xarelto was prescribed for you to reduce the risk of a blood clot forming that can cause a stroke if you have a medical condition called atrial fibrillation (a type of irregular heartbeat).  What do you need to know about xarelto ? Take your Xarelto ONCE DAILY at the same time every day with your evening meal. If you have difficulty swallowing the tablet whole, you may crush it and mix in applesauce just prior to taking your dose.  Take Xarelto exactly as prescribed by your doctor and DO NOT stop taking Xarelto without talking to the doctor who prescribed the medication.  Stopping without other stroke prevention medication to take the place of Xarelto may increase your risk of developing a clot that causes a stroke.  Refill your prescription before you run out.  After discharge, you should have regular check-up appointments with your healthcare provider that is prescribing your Xarelto.  In the future your dose may need to be changed if your kidney function or weight changes by a significant amount.  What do you do if you miss a dose? If you are taking Xarelto ONCE DAILY and you miss a dose, take it as soon as you remember on the same day then continue your regularly scheduled once daily regimen the next day. Do not take two doses of Xarelto at the same time or on the same day.   Important Safety Information A possible side effect of Xarelto is bleeding. You should call your healthcare provider right away if you experience any of the following: ? Bleeding from an injury or your nose that does not stop. ? Unusual colored urine (red or dark brown) or unusual colored stools (red or  black). ? Unusual bruising for unknown reasons. ? A serious fall or if you hit your head (even if there is no bleeding).  Some medicines may interact with Xarelto and might increase your risk of bleeding while on Xarelto. To help avoid this, consult your healthcare provider or pharmacist prior to using any new prescription or non-prescription medications, including herbals, vitamins, non-steroidal anti-inflammatory drugs (NSAIDs) and supplements.  This website has more information on Xarelto: [email or web address]

## 2016-11-19 NOTE — Progress Notes (Signed)
[facility] PROGRESS NOTE  Mike Becker TJQ:[id number] DOB: 1940-06-22 DOA: 11/13/2016 PCP: Patient, No Pcp Per     Brief summary  76 y.o.malewith a history of CAD s/p CABG 2004, COPD, HTN, and Afib (declined AC in the past)who presented with 1 week of progressive cough, weakness, leg swelling, dyspnea, and orthopnea.  Dr. Thereasa Solo spoke with Dr. Danton Sewer at the [facility] in [place].  Per his record, the only imaging they have on the patient is a CT of the abdomen in March 2018 which did reveal a 7 mm lung nodule at the right base.  Given the size of this lesion the plan was to simply monitor it with repeat imaging.  Imaging to include the hilum however has not been accomplished at the [place].  Dr. Hinda Kehr apparently reported the patient does have COPD with an FEV1/FVC of 32% predicted. -he is admitted with mrsa pneumonia with bacteremia. Underwent TEE and found to have aortic and mitral valve vegetation. Planned for 6 week of antibiotic treatment    Assessment & Plan:  Sepsis due to MRSA pneumonia  - acute hypoxic respiratory failure on admission. Sepsis physiology has resolved.  - continue antibiotic therapy and supportive care  MRSA bacteremia due to pneumonia. Infective endocarditis. 1 of 2 blood cx + on 11/13/2016. He is on iv antibiotics, improving. repeat blood culture (7/10): NGTD.   -TEE showed aortic and mitral valve vegetations.  ID following and recommended picc with 6 weeks of iv vanc from (7/8>>). End date 12/25/2016. 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 8.8x8.1x6.5cm per CT - worrisome for bronchogenic CA - mediastinal and suparclavicular LAD - scattered L pulm nodules c/w mets  -needs bronchoscopy - Dr. Gwenette Greet could perform this as an outpatient at the [facility] in [place] if the patient is otherwise stable. D/w patient  Peripheral edema. Likely due to low albumin and venous insuff - no evidence of  signif CHF on TTE - elevated feet - TED hose as able   Mild throactic aortic aneurysm. 4.1cm max AP dimension. Recommended outpatient follow up at discharge   Elevated troponin. Only very mildly elevated - likely related to RVR - follow clinically - no substernal chest pain. Echo: LVEF 60-65%, there were no regional wall motion  abnormalities.  Acute kidney injury. Nearly resolved w/ volume expansion - follow while on Vanc  Chronic atrial fibrillation with acute RVR. Now in sinus. On amiodarone, cont BB. Chadsvasc is 4 - has previously refused anticoagulation - -received IV heparin infusion, he has agreed to this and he  Is willing to cont anticoagulation with xarelto. D/w patient at length regarding anticoagulation risk and benefits and a fib. He preferred xarelto    Possible Cirrhosis. Noted on CT chest - viral hep panel negative - INR normal   Hyperkalemia - treated with oral kayexalate, lasix IV x 1, resolved Hyponatremia. Improved now.  improving w/ volume expansion but considered SIADH given tumor burden COPD. Appears severe on CT chest - followed [facility]m at [facility] - seems compensated at this time  HTN - blood pressures well controlled.    DVT prophylaxis: lovenox  Code Status: DO NOT INTUBATE Family Communication: Spoke with patient and sister at bedside: patient gave me permission to speak with sister about lung mass Disposition Plan: needs SNF. He agreed   Consultants:  ID Cardiology  Procedures: 7/8 ultrasound-guided R thoracentesis - 1.8L turbid yellow fluid 7/8 - TTE EF60-65% - no WMA -  mild LVH 7/11 - TEE [facility]) pending  Antimicrobials:  Ceftriaxone 7/7 > 7/8 Azithromycin 7/8  Vancomycin 7/8 >   HPI/Subjective: Alert. No distress. Reports feeling better. Discussed about SNF. Discussed about anticoagulation at lenght.   Objective: Vitals:   11/18/16 2159 11/19/16 0700  BP: 110/63 117/78  Pulse: 87 78  Resp:  (!) 25  Temp:  97.8 F (36.6 C)     Intake/Output Summary (Last 24 hours) at 11/19/16 1042 Last data filed at 11/19/16 1038  Gross per 24 hour  Intake            906.5 ml  Output              952 ml  Net            -45.5 ml   Filed Weights   11/17/16 1345 11/18/16 0334 11/19/16 0700  Weight: 79.8 kg (175 lb 14.8 oz) 79.8 kg (175 lb 14.8 oz) 80.3 kg (177 lb 0.5 oz)    Exam:   General:  Alert, no distress   Cardiovascular: s1,s2 rrr  Respiratory: few crackles LL  Abdomen: soft, nt, nd  Musculoskeletal: no edema    Data Reviewed: Basic Metabolic Panel:  Recent Labs Lab 11/13/16 2258  11/15/16 0304 11/16/16 0510 11/17/16 0229 11/18/16 0542 11/19/16 0313  NA  --   < > 129* 132* 135 132* 130*  K  --   < > 5.4* 5.6* 4.7 4.6 4.8  CL  --   < > 99* 100* 102 100* 96*  CO2  --   < > 25 25 29 26 30   GLUCOSE  --   < > 115* 102* 120* 103* 102*  BUN  --   < > 49* 34* 27* 22* 19  CREATININE  --   < > 1.08 0.95 0.83 0.84 0.91  CALCIUM  --   < > 8.2* 8.3* 8.1* 7.7* 8.1*  MG 2.1  --   --   --  1.8  --   --   < > = values in this interval not displayed. Liver Function Tests:  Recent Labs Lab 11/13/16 1714 11/15/16 0304  AST 37 31  ALT 25 23  ALKPHOS 186* 139*  BILITOT 1.0 0.5  PROT 6.9 5.6*  ALBUMIN 2.1* 1.6*   No results for input(s): LIPASE, AMYLASE in the last 168 hours. No results for input(s): AMMONIA in the last 168 hours. CBC:  Recent Labs Lab 11/13/16 1714 11/14/16 0552 11/15/16 0304 11/16/16 0510 11/17/16 0229 11/19/16 0313  WBC 17.5* 13.7* 11.2* 10.7* 10.3 11.1*  NEUTROABS 16.0*  --   --   --   --   --   HGB 15.6 13.9 13.6 14.8 13.8 13.4  HCT 48.2 43.7 44.0 46.5 44.7 42.7  MCV 82.1 82.6 84.0 83.8 82.9 82.9  PLT 286 238 238 227 210 218   Cardiac Enzymes:  Recent Labs Lab 11/13/16 1828 11/13/16 2258 11/14/16 0552  TROPONINI 0.07* 0.04* 0.06*   BNP (last 3 results)  Recent Labs  11/13/16 1714  BNP 193.6*    ProBNP (last 3 results) No results for input(s): PROBNP in  the last 8760 hours.  CBG: No results for input(s): GLUCAP in the last 168 hours.  Recent Results (from the past 240 hour(s))  Culture, blood (Routine x 2)     Status: None   Collection Time: 11/13/16  5:10 PM  Result Value Ref Range Status   Specimen Description BLOOD RIGHT ANTECUBITAL  Final   Special  Requests   Final    BOTTLES DRAWN AEROBIC AND ANAEROBIC Blood Culture adequate volume   Culture NO GROWTH 5 DAYS  Final   Report Status 11/18/2016 FINAL  Final  Culture, blood (Routine x 2)     Status: Abnormal   Collection Time: 11/13/16  5:25 PM  Result Value Ref Range Status   Specimen Description BLOOD LEFT ANTECUBITAL  Final   Special Requests   Final    BOTTLES DRAWN AEROBIC AND ANAEROBIC Blood Culture adequate volume   Culture  Setup Time   Final    GRAM POSITIVE COCCI IN CLUSTERS ANAEROBIC BOTTLE ONLY CRITICAL RESULT CALLED TO, READ BACK BY AND VERIFIED WITH: [name],PHARMD 11/15/16 1455 [name]    Culture METHICILLIN RESISTANT STAPHYLOCOCCUS AUREUS (A)  Final   Report Status 11/17/2016 FINAL  Final   Organism ID, Bacteria METHICILLIN RESISTANT STAPHYLOCOCCUS AUREUS  Final      Susceptibility   Methicillin resistant staphylococcus aureus - MIC*    CIPROFLOXACIN <=0.5 SENSITIVE Sensitive     ERYTHROMYCIN >=8 RESISTANT Resistant     GENTAMICIN <=0.5 SENSITIVE Sensitive     OXACILLIN >=4 RESISTANT Resistant     TETRACYCLINE <=1 SENSITIVE Sensitive     VANCOMYCIN <=0.5 SENSITIVE Sensitive     TRIMETH/SULFA <=10 SENSITIVE Sensitive     CLINDAMYCIN <=0.25 SENSITIVE Sensitive     RIFAMPIN <=0.5 SENSITIVE Sensitive     Inducible Clindamycin NEGATIVE Sensitive     * METHICILLIN RESISTANT STAPHYLOCOCCUS AUREUS  Blood Culture ID Panel (Reflexed)     Status: Abnormal   Collection Time: 11/13/16  5:25 PM  Result Value Ref Range Status   Enterococcus species NOT DETECTED NOT DETECTED Final   Listeria monocytogenes NOT DETECTED NOT DETECTED Final   Staphylococcus species  DETECTED (A) NOT DETECTED Final    Comment: CRITICAL RESULT CALLED TO, READ BACK BY AND VERIFIED WITH: [name], PHARMD 11/15/16 1455 [name]    Staphylococcus aureus DETECTED (A) NOT DETECTED Final    Comment: Methicillin (oxacillin)-resistant Staphylococcus aureus (MRSA). MRSA is predictably resistant to beta-lactam antibiotics (except ceftaroline). Preferred therapy is vancomycin unless clinically contraindicated. Patient requires contact precautions if  hospitalized. CRITICAL RESULT CALLED TO, READ BACK BY AND VERIFIED WITH: [name], PHARMD 11/15/16 [name]    Methicillin resistance DETECTED (A) NOT DETECTED Final    Comment: CRITICAL RESULT CALLED TO, READ BACK BY AND VERIFIED WITH: [name],PHARMD 11/15/16 1455 [name]    Streptococcus species NOT DETECTED NOT DETECTED Final   Streptococcus agalactiae NOT DETECTED NOT DETECTED Final   Streptococcus pneumoniae NOT DETECTED NOT DETECTED Final   Streptococcus pyogenes NOT DETECTED NOT DETECTED Final   Acinetobacter baumannii NOT DETECTED NOT DETECTED Final   Enterobacteriaceae species NOT DETECTED NOT DETECTED Final   Enterobacter cloacae complex NOT DETECTED NOT DETECTED Final   Escherichia coli NOT DETECTED NOT DETECTED Final   Klebsiella oxytoca NOT DETECTED NOT DETECTED Final   Klebsiella pneumoniae NOT DETECTED NOT DETECTED Final   Proteus species NOT DETECTED NOT DETECTED Final   Serratia marcescens NOT DETECTED NOT DETECTED Final   Haemophilus influenzae NOT DETECTED NOT DETECTED Final   Neisseria meningitidis NOT DETECTED NOT DETECTED Final   Pseudomonas aeruginosa NOT DETECTED NOT DETECTED Final   Candida albicans NOT DETECTED NOT DETECTED Final   Candida glabrata NOT DETECTED NOT DETECTED Final   Candida krusei NOT DETECTED NOT DETECTED Final   Candida parapsilosis NOT DETECTED NOT DETECTED Final   Candida tropicalis NOT DETECTED NOT DETECTED Final  Urine culture     Status: Abnormal   Collection Time: 11/13/16   5:34 PM  Result Value Ref Range Status   Specimen Description URINE, RANDOM  Final   Special Requests Normal  Final   Culture <10,000 COLONIES/mL INSIGNIFICANT GROWTH (A)  Final   Report Status 11/14/2016 FINAL  Final  MRSA PCR Screening     Status: Abnormal   Collection Time: 11/13/16 11:00 PM  Result Value Ref Range Status   MRSA by PCR POSITIVE (A) NEGATIVE Final    Comment:        The GeneXpert MRSA Assay (FDA approved for NASAL specimens only), is one component of a comprehensive MRSA colonization surveillance program. It is not intended to diagnose MRSA infection nor to guide or monitor treatment for MRSA infections. RESULT CALLED TO, READ BACK BY AND VERIFIED WITH: RN KARA MCKIBBEN [id number] @0233  THANEY   Culture, sputum-assessment     Status: None   Collection Time: 11/14/16  1:13 AM  Result Value Ref Range Status   Specimen Description SPUTUM  Final   Special Requests NONE  Final   Sputum evaluation   Final    Sputum specimen not acceptable for testing.  Please recollect.   Gram Stain Report Called to,Read Back By and Verified With: B RONCALO,RN AT 0740 11/14/16 BY L BENFIELD    Report Status 11/14/2016 FINAL  Final  Culture, body fluid-bottle     Status: None (Preliminary result)   Collection Time: 11/14/16  9:50 AM  Result Value Ref Range Status   Specimen Description FLUID RIGHT PLEURAL  Final   Special Requests NONE  Final   Culture NO GROWTH 4 DAYS  Final   Report Status PENDING  Incomplete  Gram stain     Status: None   Collection Time: 11/14/16  9:50 AM  Result Value Ref Range Status   Specimen Description FLUID RIGHT PLEURAL  Final   Special Requests NONE  Final   Gram Stain   Final    ABUNDANT WBC PRESENT, PREDOMINANTLY PMN RARE GRAM POSITIVE COCCI IN CLUSTERS    Report Status 11/14/2016 FINAL  Final  Culture, blood (routine x 2)     Status: None (Preliminary result)   Collection Time: 11/16/16  5:10 AM  Result Value Ref Range Status   Specimen  Description BLOOD LEFT ARM  Final   Special Requests IN PEDIATRIC BOTTLE Blood Culture adequate volume  Final   Culture NO GROWTH 2 DAYS  Final   Report Status PENDING  Incomplete  Culture, blood (routine x 2)     Status: None (Preliminary result)   Collection Time: 11/16/16  5:12 AM  Result Value Ref Range Status   Specimen Description BLOOD BLOOD LEFT FOREARM  Final   Special Requests IN PEDIATRIC BOTTLE Blood Culture adequate volume  Final   Culture NO GROWTH 2 DAYS  Final   Report Status PENDING  Incomplete     Studies: Dg Swallowing Func-speech Pathology  Result Date: 11/18/2016 Objective Swallowing Evaluation: Type of Study: MBS-Modified Barium Swallow Study Patient Details Name: Mike Becker MRN: [id number] Date of Birth: 05/03/1941 Today's Date: 11/18/2016 Time: SLP Start Time (ACUTE ONLY): 1238-SLP Stop Time (ACUTE ONLY): 1300 SLP Time Calculation (min) (ACUTE ONLY): 22 min Past Medical History: Past Medical History: Diagnosis Date . A-fib ([place])  . COPD (chronic obstructive pulmonary disease) ([place])  . Dyspnea  . Dysrhythmia  . Hypertension  . MI (myocardial infarction) [facility])  Past Surgical History: Past Surgical History: Procedure Laterality Date .  EXPLORATION POST OPERATIVE OPEN HEART   . TEE WITHOUT CARDIOVERSION N/A 11/17/2016  Procedure: TRANSESOPHAGEAL ECHOCARDIOGRAM (TEE);  Surgeon: Dixie Dials, MD;  Location: [facility] ENDOSCOPY;  Service: Cardiovascular;  Laterality: N/A; . triple bypass   HPI: 76 y.o. male with a past medical history significant for CAD s/p CABG 2004, uvulectomy, COPD not on home O2, HTN and Afib declined AC who presents with 1 week progressive cough, weakness, dyspnea, orthopnea.;7/8 s/p thoracentesis removed 1.8L. CXR: right base opacity, rounded and masslike with parapneumonic effusion. Dx with right lung mass No Data Recorded Assessment / Plan / Recommendation CHL IP CLINICAL IMPRESSIONS 11/18/2016 Clinical Impression Patient presents with a moderate pharyngeal  phase dysphagia characterized by combination of delayed in swallow initiation and decreased laryngeal closure resulting in penetration before and/or during the swallow. Penetration of nectar thick liquids and pureed solid shallow and cleared quickly with combination of spontaneous dry swallow and cued cough. Penetration of thin liquids however deep and does not consistently clear significantly increasing risk of aspiration. Risk also increases due to intermittent episodes of post swallow penetration due to presence of vallecular and pyriform sinus residue.  Highly suspect some degree of chronic dysphagia with h/o COPD and dyspnea, exacerbated by acute condition. Recommend downgrading diet to mitigate risk of aspiration with hopeful ability to be able to advance with use of compensatory strategies as functonal reserve for ability to tolerate aspiration improves.  SLP Visit Diagnosis Dysphagia, unspecified (R13.10) Attention and concentration deficit following -- Frontal lobe and executive function deficit following -- Impact on safety and function Moderate aspiration risk;Severe aspiration risk   CHL IP TREATMENT RECOMMENDATION 11/18/2016 Treatment Recommendations Therapy as outlined in treatment plan below   Prognosis 11/18/2016 Prognosis for Safe Diet Advancement Good Barriers to Reach Goals -- Barriers/Prognosis Comment -- CHL IP DIET RECOMMENDATION 11/18/2016 SLP Diet Recommendations Dysphagia 3 (Mech soft) solids;Nectar thick liquid Liquid Administration via Cup;No straw Medication Administration Whole meds with puree Compensations Slow rate;Small sips/bites;Clear throat intermittently Postural Changes Seated upright at 90 degrees   CHL IP OTHER RECOMMENDATIONS 11/18/2016 Recommended Consults -- Oral Care Recommendations Oral care BID Other Recommendations Order thickener from pharmacy;Prohibited food (jello, ice cream, thin soups);Remove water pitcher   CHL IP FOLLOW UP RECOMMENDATIONS 11/18/2016 Follow up  Recommendations (No Data)   CHL IP FREQUENCY AND DURATION 11/18/2016 Speech Therapy Frequency (ACUTE ONLY) min 3x week Treatment Duration 2 weeks      CHL IP ORAL PHASE 11/18/2016 Oral Phase WFL Oral - Pudding Teaspoon -- Oral - Pudding Cup -- Oral - Honey Teaspoon -- Oral - Honey Cup -- Oral - Nectar Teaspoon -- Oral - Nectar Cup -- Oral - Nectar Straw -- Oral - Thin Teaspoon -- Oral - Thin Cup -- Oral - Thin Straw -- Oral - Puree -- Oral - Mech Soft -- Oral - Regular -- Oral - Multi-Consistency -- Oral - Pill -- Oral Phase - Comment --  CHL IP PHARYNGEAL PHASE 11/18/2016 Pharyngeal Phase Impaired Pharyngeal- Pudding Teaspoon -- Pharyngeal -- Pharyngeal- Pudding Cup -- Pharyngeal -- Pharyngeal- Honey Teaspoon -- Pharyngeal -- Pharyngeal- Honey Cup -- Pharyngeal -- Pharyngeal- Nectar Teaspoon Delayed swallow initiation-vallecula;Reduced airway/laryngeal closure;Penetration/Aspiration during swallow;Reduced tongue base retraction;Reduced anterior laryngeal mobility;Pharyngeal residue - valleculae;Pharyngeal residue - pyriform Pharyngeal Material enters airway, remains ABOVE vocal cords and not ejected out Pharyngeal- Nectar Cup Delayed swallow initiation-vallecula;Reduced airway/laryngeal closure;Penetration/Aspiration during swallow;Reduced anterior laryngeal mobility;Reduced tongue base retraction;Pharyngeal residue - valleculae;Pharyngeal residue - pyriform Pharyngeal Material enters airway, remains ABOVE vocal cords and not ejected out Pharyngeal-  Nectar Straw -- Pharyngeal -- Pharyngeal- Thin Teaspoon -- Pharyngeal -- Pharyngeal- Thin Cup Delayed swallow initiation-pyriform sinuses;Delayed swallow initiation-vallecula;Penetration/Aspiration before swallow;Penetration/Aspiration during swallow;Reduced anterior laryngeal mobility;Reduced tongue base retraction;Pharyngeal residue - pyriform;Pharyngeal residue - valleculae Pharyngeal Material enters airway, CONTACTS cords and not ejected out Pharyngeal- Thin Straw --  Pharyngeal -- Pharyngeal- Puree Delayed swallow initiation-vallecula;Reduced airway/laryngeal closure;Reduced tongue base retraction;Reduced anterior laryngeal mobility;Pharyngeal residue - valleculae;Pharyngeal residue - pyriform Pharyngeal -- Pharyngeal- Mechanical Soft Delayed swallow initiation-vallecula;Pharyngeal residue - valleculae;Pharyngeal residue - pyriform;Reduced airway/laryngeal closure;Reduced tongue base retraction Pharyngeal -- Pharyngeal- Regular -- Pharyngeal -- Pharyngeal- Multi-consistency -- Pharyngeal -- Pharyngeal- Pill WFL Pharyngeal -- Pharyngeal Comment --  CHL IP CERVICAL ESOPHAGEAL PHASE 11/18/2016 Cervical Esophageal Phase WFL Pudding Teaspoon -- Pudding Cup -- Honey Teaspoon -- Honey Cup -- Nectar Teaspoon -- Nectar Cup -- Nectar Straw -- Thin Teaspoon -- Thin Cup -- Thin Straw -- Puree -- Mechanical Soft -- Regular -- Multi-consistency -- Pill -- Cervical Esophageal Comment -- Gabriel Rainwater MA, CCC-SLP [phone] [place] Leah Meryl 11/18/2016, 3:15 PM               Scheduled Meds: . amiodarone  200 mg Oral BID  . arformoterol  15 mcg Nebulization BID  . aspirin EC  81 mg Oral Daily  . gabapentin  600 mg Oral TID  . metoprolol tartrate  25 mg Oral BID  . nicotine  21 mg Transdermal Daily  . nystatin  5 mL Oral QID  . rivaroxaban   Does not apply Once  . rivaroxaban  20 mg Oral Q supper  . sodium chloride flush  10-40 mL Intracatheter Q12H  . tamsulosin  0.4 mg Oral BID  . tiotropium  18 mcg Inhalation Daily   Continuous Infusions: . 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 If 7PM-7AM, please contact night-coverage at [email or web address], password [facility] 11/19/2016, 10:42 AM  LOS: 6 days

## 2016-11-19 NOTE — Evaluation (Signed)
Occupational Therapy Evaluation Patient Details Name: Mike Becker MRN: [id number] DOB: [birth year] Today's Date: 11/19/2016    History of Present Illness 76 y.o. male with a past medical history significant for CAD s/p CABG 2004, COPD not on home O2, HTN and Afib declined AC who presents with 1 week progressive cough, weakness, dyspnea, orthopnea.;7/8 s/p thoracentesis removed 1.8L   Clinical Impression   Pt was independent prior to admission. Presents with generalized weakness, decreased activity tolerance, impaired standing balance with R hip pain contributing to imbalance. Pt requires set up to max assist with ADL and min assist for mobility. He refuse use of a RW as he doesn't want to appear old. He demonstrates impaired ST memory. Recommending SNF for ST rehab. Will follow acutely.    Follow Up Recommendations  SNF    Equipment Recommendations  3 in 1 bedside commode    Recommendations for Other Services       Precautions / Restrictions Precautions Precautions: Fall Precaution Comments: denies h/o falls; endorses recent imbalance due to illness Restrictions Weight Bearing Restrictions: No      Mobility Bed Mobility Overal bed mobility: Needs Assistance Bed Mobility: Supine to Sit     Supine to sit: Min assist     General bed mobility comments: assist for R LE to EOB, increased time  Transfers Overall transfer level: Needs assistance Equipment used: 1 person hand held assist Transfers: Sit to/from Stand Sit to Stand: Min assist         General transfer comment: steadying assist    Balance Overall balance assessment: Needs assistance Sitting-balance support: No upper extremity supported;Feet supported Sitting balance-Leahy Scale: Good     Standing balance support: During functional activity Standing balance-Leahy Scale: Poor Standing balance comment: hip pain interfering                           ADL either performed or assessed with  clinical judgement   ADL Overall ADL's : Needs assistance/impaired Eating/Feeding: Independent;Sitting   Grooming: Wash/dry hands;Wash/dry face;Sitting;Set up   Upper Body Bathing: Minimal assistance;Sitting   Lower Body Bathing: Sit to/from stand;Maximal assistance   Upper Body Dressing : Minimal assistance;Sitting   Lower Body Dressing: Sit to/from stand;Maximal assistance   Toilet Transfer: Minimal assistance;Stand-pivot Toilet Transfer Details (indicate cue type and reason): simulated to chair Toileting- Clothing Manipulation and Hygiene: Minimal assistance;Sit to/from stand               Vision Patient Visual Report: No change from baseline       Perception     Praxis      Pertinent Vitals/Pain Pain Assessment: Faces Faces Pain Scale: Hurts little more Pain Location: R hip Pain Descriptors / Indicators: Grimacing;Guarding;Sore Pain Intervention(s): Monitored during session;Repositioned     Hand Dominance Right   Extremity/Trunk Assessment Upper Extremity Assessment Upper Extremity Assessment: Generalized weakness   Lower Extremity Assessment Lower Extremity Assessment: Defer to PT evaluation   Cervical / Trunk Assessment Cervical / Trunk Assessment: Other exceptions Cervical / Trunk Exceptions: forward head   Communication Communication Communication: No difficulties   Cognition Arousal/Alertness: Awake/alert Behavior During Therapy: Flat affect Overall Cognitive Status: Impaired/Different from baseline Area of Impairment: Memory                     Memory: Decreased short-term memory;Decreased recall of precautions         General Comments: decreased recall of swallowing precautions, need for thickened liquids  General Comments       Exercises     Shoulder Instructions      Home Living Family/patient expects to be discharged to:: Private residence Living Arrangements: Alone Available Help at Discharge: Family;Available  PRN/intermittently Type of Home: Apartment Home Access: Level entry     Home Layout: One level     Bathroom Shower/Tub: Teacher, early years/pre: Handicapped height     Home Equipment: Grab bars - tub/shower;Grab bars - toilet          Prior Functioning/Environment Level of Independence: Independent        Comments: still drives        OT Problem List: Decreased strength;Decreased activity tolerance;Impaired balance (sitting and/or standing);Decreased knowledge of use of DME or AE;Pain;Cardiopulmonary status limiting activity      OT Treatment/Interventions: Self-care/ADL training;DME and/or AE instruction;Patient/family education;Balance training;Therapeutic activities    OT Goals(Current goals can be found in the care plan section) Acute Rehab OT Goals Patient Stated Goal: eat some bacon OT Goal Formulation: With patient Time For Goal Achievement: 12/03/16 Potential to Achieve Goals: Good ADL Goals Pt Will Perform Grooming: standing;with supervision Pt Will Perform Lower Body Bathing: with supervision;sit to/from stand Pt Will Perform Lower Body Dressing: with supervision;sit to/from stand Pt Will Transfer to Toilet: with supervision;ambulating;bedside commode Pt Will Perform Toileting - Clothing Manipulation and hygiene: with supervision;sit to/from stand  OT Frequency: Min 2X/week   Barriers to D/C: Decreased caregiver support          Co-evaluation              AM-PAC PT "6 Clicks" Daily Activity     Outcome Measure Help from another person eating meals?: None Help from another person taking care of personal grooming?: A Little Help from another person toileting, which includes using toliet, bedpan, or urinal?: A Little Help from another person bathing (including washing, rinsing, drying)?: A Lot Help from another person to put on and taking off regular upper body clothing?: None Help from another person to put on and taking off regular  lower body clothing?: A Lot 6 Click Score: 18   End of Session Equipment Utilized During Treatment: Gait belt;Oxygen Nurse Communication:  (nees thickener)  Activity Tolerance: Patient limited by fatigue;Patient limited by pain Patient left: in chair;with call bell/phone within reach;with chair alarm set  OT Visit Diagnosis: Unsteadiness on feet (R26.81);Pain;Muscle weakness (generalized) (M62.81) Pain - Right/Left: Right Pain - part of body: Hip                Time: 0910-0930 OT Time Calculation (min): 20 min Charges:  OT General Charges $OT Visit: 1 Procedure OT Evaluation $OT Eval Moderate Complexity: 1 Procedure G-Codes:     [name] 11/19/2016, 9:42 AM  [phone]

## 2016-11-19 NOTE — Progress Notes (Signed)
Patient will discharge to [facility] Anticipated discharge date: 11/19/16 Family notified: Steward Drone, sister Transportation by: [facility]   CSW signing off.  Estanislado Emms, [place]  Clinical Social Worker

## 2016-11-19 NOTE — Progress Notes (Signed)
Peripherally Inserted Central Catheter/Midline Placement  The IV Nurse has discussed with the patient and/or persons authorized to consent for the patient, the purpose of this procedure and the potential benefits and risks involved with this procedure.  The benefits include less needle sticks, lab draws from the catheter, and the patient may be discharged home with the catheter. Risks include, but not limited to, infection, bleeding, blood clot (thrombus formation), and puncture of an artery; nerve damage and irregular heartbeat and possibility to perform a PICC exchange if needed/ordered by physician.  Alternatives to this procedure were also discussed.  Bard Power PICC patient education guide, fact sheet on infection prevention and patient information card has been provided to patient /or left at bedside.    PICC/Midline Placement Documentation        Mike Becker 11/19/2016, 9:24 AM

## 2016-11-19 NOTE — Progress Notes (Signed)
Report called to Geraldo Docker, RN for transfer to [facility].

## 2016-11-19 NOTE — Progress Notes (Signed)
Subjective:  Patient denies any chest pain or shortness of breath denies any hemoptysis. Remains in sinus rhythm since yesterday.  Objective:  Vital Signs in the last 24 hours: Temp:  [97.8 F (36.6 C)-99 F (37.2 C)] 97.8 F (36.6 C) (07/13 0700) Pulse Rate:  [76-98] 76 (07/13 0720) Resp:  [19-25] 25 (07/13 0720) BP: (110-123)/(63-78) 117/78 (07/13 0700) SpO2:  [95 %-97 %] 97 % (07/13 0720) Weight:  [80.3 kg (177 lb 0.5 oz)] 80.3 kg (177 lb 0.5 oz) (07/13 0700)  Intake/Output from previous day: 07/12 0701 - 07/13 0700 In: 1112.5 [P.O.:700; I.V.:412.5] Out: 1750 [Urine:1750] Intake/Output from this shift: Total I/O In: 350 [P.O.:200; IV Piggyback:150] Out: 2 [Urine:1; Stool:1]  Physical Exam: Exam unchanged  Lab Results:  Recent Labs  11/17/16 0229 11/19/16 0313  WBC 10.3 11.1*  HGB 13.8 13.4  PLT 210 218    Recent Labs  11/18/16 0542 11/19/16 0313  NA 132* 130*  K 4.6 4.8  CL 100* 96*  CO2 26 30  GLUCOSE 103* 102*  BUN 22* 19  CREATININE 0.84 0.91   No results for input(s): TROPONINI in the last 72 hours.  Invalid input(s): CK, MB Hepatic Function Panel No results for input(s): PROT, ALBUMIN, AST, ALT, ALKPHOS, BILITOT, BILIDIR, IBILI in the last 72 hours. No results for input(s): CHOL in the last 72 hours. No results for input(s): PROTIME in the last 72 hours.  Imaging: Imaging results have been reviewed and Dg Swallowing Func-speech Pathology  Result Date: 11/18/2016 Objective Swallowing Evaluation: Type of Study: MBS-Modified Barium Swallow Study Patient Details Name: Mike Becker MRN: [id number] Date of Birth: May 10, 1941 Today's Date: 11/18/2016 Time: SLP Start Time (ACUTE ONLY): 1238-SLP Stop Time (ACUTE ONLY): 1300 SLP Time Calculation (min) (ACUTE ONLY): 22 min Past Medical History: Past Medical History: Diagnosis Date . A-fib ([place])  . COPD (chronic obstructive pulmonary disease) ([place])  . Dyspnea  . Dysrhythmia  . Hypertension  . MI (myocardial  infarction) [facility])  Past Surgical History: Past Surgical History: Procedure Laterality Date . EXPLORATION POST OPERATIVE OPEN HEART   . TEE WITHOUT CARDIOVERSION N/A 11/17/2016  Procedure: TRANSESOPHAGEAL ECHOCARDIOGRAM (TEE);  Surgeon: Dixie Dials, MD;  Location: [facility] ENDOSCOPY;  Service: Cardiovascular;  Laterality: N/A; . triple bypass   HPI: 76 y.o. male with a past medical history significant for CAD s/p CABG 2004, uvulectomy, COPD not on home O2, HTN and Afib declined AC who presents with 1 week progressive cough, weakness, dyspnea, orthopnea.;7/8 s/p thoracentesis removed 1.8L. CXR: right base opacity, rounded and masslike with parapneumonic effusion. Dx with right lung mass No Data Recorded Assessment / Plan / Recommendation CHL IP CLINICAL IMPRESSIONS 11/18/2016 Clinical Impression Patient presents with a moderate pharyngeal phase dysphagia characterized by combination of delayed in swallow initiation and decreased laryngeal closure resulting in penetration before and/or during the swallow. Penetration of nectar thick liquids and pureed solid shallow and cleared quickly with combination of spontaneous dry swallow and cued cough. Penetration of thin liquids however deep and does not consistently clear significantly increasing risk of aspiration. Risk also increases due to intermittent episodes of post swallow penetration due to presence of vallecular and pyriform sinus residue.  Highly suspect some degree of chronic dysphagia with h/o COPD and dyspnea, exacerbated by acute condition. Recommend downgrading diet to mitigate risk of aspiration with hopeful ability to be able to advance with use of compensatory strategies as functonal reserve for ability to tolerate aspiration improves.  SLP Visit Diagnosis Dysphagia, unspecified (R13.10) Attention and concentration deficit  following -- Frontal lobe and executive function deficit following -- Impact on safety and function Moderate aspiration risk;Severe aspiration  risk   CHL IP TREATMENT RECOMMENDATION 11/18/2016 Treatment Recommendations Therapy as outlined in treatment plan below   Prognosis 11/18/2016 Prognosis for Safe Diet Advancement Good Barriers to Reach Goals -- Barriers/Prognosis Comment -- CHL IP DIET RECOMMENDATION 11/18/2016 SLP Diet Recommendations Dysphagia 3 (Mech soft) solids;Nectar thick liquid Liquid Administration via Cup;No straw Medication Administration Whole meds with puree Compensations Slow rate;Small sips/bites;Clear throat intermittently Postural Changes Seated upright at 90 degrees   CHL IP OTHER RECOMMENDATIONS 11/18/2016 Recommended Consults -- Oral Care Recommendations Oral care BID Other Recommendations Order thickener from pharmacy;Prohibited food (jello, ice cream, thin soups);Remove water pitcher   CHL IP FOLLOW UP RECOMMENDATIONS 11/18/2016 Follow up Recommendations (No Data)   CHL IP FREQUENCY AND DURATION 11/18/2016 Speech Therapy Frequency (ACUTE ONLY) min 3x week Treatment Duration 2 weeks      CHL IP ORAL PHASE 11/18/2016 Oral Phase WFL Oral - Pudding Teaspoon -- Oral - Pudding Cup -- Oral - Honey Teaspoon -- Oral - Honey Cup -- Oral - Nectar Teaspoon -- Oral - Nectar Cup -- Oral - Nectar Straw -- Oral - Thin Teaspoon -- Oral - Thin Cup -- Oral - Thin Straw -- Oral - Puree -- Oral - Mech Soft -- Oral - Regular -- Oral - Multi-Consistency -- Oral - Pill -- Oral Phase - Comment --  CHL IP PHARYNGEAL PHASE 11/18/2016 Pharyngeal Phase Impaired Pharyngeal- Pudding Teaspoon -- Pharyngeal -- Pharyngeal- Pudding Cup -- Pharyngeal -- Pharyngeal- Honey Teaspoon -- Pharyngeal -- Pharyngeal- Honey Cup -- Pharyngeal -- Pharyngeal- Nectar Teaspoon Delayed swallow initiation-vallecula;Reduced airway/laryngeal closure;Penetration/Aspiration during swallow;Reduced tongue base retraction;Reduced anterior laryngeal mobility;Pharyngeal residue - valleculae;Pharyngeal residue - pyriform Pharyngeal Material enters airway, remains ABOVE vocal cords and not ejected  out Pharyngeal- Nectar Cup Delayed swallow initiation-vallecula;Reduced airway/laryngeal closure;Penetration/Aspiration during swallow;Reduced anterior laryngeal mobility;Reduced tongue base retraction;Pharyngeal residue - valleculae;Pharyngeal residue - pyriform Pharyngeal Material enters airway, remains ABOVE vocal cords and not ejected out Pharyngeal- Nectar Straw -- Pharyngeal -- Pharyngeal- Thin Teaspoon -- Pharyngeal -- Pharyngeal- Thin Cup Delayed swallow initiation-pyriform sinuses;Delayed swallow initiation-vallecula;Penetration/Aspiration before swallow;Penetration/Aspiration during swallow;Reduced anterior laryngeal mobility;Reduced tongue base retraction;Pharyngeal residue - pyriform;Pharyngeal residue - valleculae Pharyngeal Material enters airway, CONTACTS cords and not ejected out Pharyngeal- Thin Straw -- Pharyngeal -- Pharyngeal- Puree Delayed swallow initiation-vallecula;Reduced airway/laryngeal closure;Reduced tongue base retraction;Reduced anterior laryngeal mobility;Pharyngeal residue - valleculae;Pharyngeal residue - pyriform Pharyngeal -- Pharyngeal- Mechanical Soft Delayed swallow initiation-vallecula;Pharyngeal residue - valleculae;Pharyngeal residue - pyriform;Reduced airway/laryngeal closure;Reduced tongue base retraction Pharyngeal -- Pharyngeal- Regular -- Pharyngeal -- Pharyngeal- Multi-consistency -- Pharyngeal -- Pharyngeal- Pill WFL Pharyngeal -- Pharyngeal Comment --  CHL IP CERVICAL ESOPHAGEAL PHASE 11/18/2016 Cervical Esophageal Phase WFL Pudding Teaspoon -- Pudding Cup -- Honey Teaspoon -- Honey Cup -- Nectar Teaspoon -- Nectar Cup -- Nectar Straw -- Thin Teaspoon -- Thin Cup -- Thin Straw -- Puree -- Mechanical Soft -- Regular -- Multi-consistency -- Pill -- Cervical Esophageal Comment -- Gabriel Rainwater MA, CCC-SLP [phone] McCoy Leah Meryl 11/18/2016, 3:15 PM               Cardiac Studies:  Assessment/Plan:  Status post Paroxysmal A. fib with RVR chadsvasc score of  4 Right lung pneumonia with parapneumonic effusion Possible aortic valve endocarditis Right lung mass Moderate aortic regurgitation MRSA bacteremia probably secondary to above rule out endocarditis Coronary artery disease history of MI in the past status post CABG 3 in 2004 in [place]  [place] Hypertension History of diastolic congestive heart failure COPD on home O2 Plan Okay to switch IV heparin to Eliquis. I will sign off please call if needed   LOS: 6 days    Charolette Forward 11/19/2016, 12:17 PM

## 2016-11-19 NOTE — Discharge Summary (Signed)
Physician Discharge Summary  Mike Becker PNT:[id number] DOB: Sep 24, 1940 DOA: 11/13/2016  PCP: Patient, No Pcp Per  Admit date: 11/13/2016 Discharge date: 11/19/2016  Time spent: >35 minutes  Recommendations for Outpatient Follow-up:  MD at SNF Weeks labs BMP, CBC while on iv vancomycin at SNF. 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 Active Problems:   Sepsis ([place])   Community acquired pneumonia of right lower lobe of lung ([place])   Parapneumonic effusion   Acute on chronic diastolic CHF (congestive heart failure) ([name])   Elevated troponin   AKI (acute kidney injury) ([place])   Hyponatremia   Hyperkalemia   Essential hypertension   AF (paroxysmal atrial fibrillation) ([name])   Coronary artery disease due to lipid rich plaque   COPD (chronic obstructive pulmonary disease) ([name])   MRSA bacteremia   Pleural effusion on right   Discharge Condition: stable   Diet recommendation: low sodium   Filed Weights   11/17/16 1345 11/18/16 0334 11/19/16 0700  Weight: 79.8 kg (175 lb 14.8 oz) 79.8 kg (175 lb 14.8 oz) 80.3 kg (177 lb 0.5 oz)    History of present illness:  76 y.o.malewith a history of CAD s/p CABG 2004, COPD, HTN, and Afib (declined AC in the past)who presented with 1 week of progressive cough, weakness, leg swelling, dyspnea, and orthopnea. Dr. Thereasa Solo spoke with Dr. Danton Sewer at the [facility] in [place]. Per his record, the only imaging they have on the patient is a CT of the abdomen in March 2018 which did reveal a 7 mm lung nodule at the right base. Given the size of this lesion the plan was to simply monitor it with repeat imaging. Imaging to include the hilum however has not been accomplished at the [place]. Dr. Hinda Kehr apparently reported the patient does have COPD with an FEV1/FVC of 32% predicted. -he is  admitted with mrsa pneumonia with bacteremia. Underwent TEE and found to have aortic and mitral valve vegetation. Planned for 6 week of antibiotic treatment   Hospital Course:   Sepsis due to MRSA pneumonia  - acute hypoxic respiratory failure on admission. Sepsis physiology has resolved. respiratory failure->resolved.  - continue antibiotic therapy and supportive care  MRSA bacteremia due to pneumonia. Infective endocarditis. 1 of 2 blood cx + on 11/13/2016. Received iv antibiotics, then repeat blood culture (7/10): NGTD.   -TEE showed aortic and mitral valve vegetations.  ID followed and recommended picc line with 6 weeks of iv vanc from (7/8>>). End date 12/25/2016.  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 8.8x8.1x6.5cm per CT - worrisome for bronchogenic CA- mediastinal and suparclavicular LAD - scattered L pulm nodules c/w mets  -needs bronchoscopy - Dr. Gwenette Greet could perform this as an outpatient at the [facility] in [place] if the patient is otherwise stable. D/w patient, agreed to f/u       Elevated troponin. Only very mildly elevated - likely related to RVR - follow clinically - no substernal chest pain. Echo: LVEF    60-65%, there were no regional wall motionabnormalities.  Acute kidney injury. Nearly resolved w/ volume expansion - follow while on Vanc   Chronic atrial fibrillation with acute RVR. Now in sinus. On amiodarone, cont BB. Chadsvasc is 4 -received IV heparin infusion, he has agreed to  this and he  Is willing to cont anticoagulation with xarelto. D/w patient at length regarding anticoagulation risk and benefits and a fib. He preferred xarelto.     Peripheral edema. Likely due to low albumin and venous insuff - no evidence of signif CHF on TTE - elevated feet - TED hose  Mild throactic aortic aneurysm. 4.1cm max AP dimension. Recommended outpatient follow up at discharge  Possible Cirrhosis. Noted on CT  chest - viral hep panel negative - INR normal  Hyperkalemia - treated with oral kayexalate, lasix IV x 1, resolved Hyponatremia. Improved now. improving w/ volume expansion but considered SIADH given tumor burden COPD. Appears severe on CT chest - followed [facility] - seemscompensated at this time  HTN - blood pressures well controlled.   Procedures: 7/8 ultrasound-guided R thoracentesis - 1.8L turbid yellow fluid 7/8 - TTE EF60-65% - no WMA - mild LVH  7/11 - TEE    (i.e. Studies not automatically included, echos, thoracentesis, etc; not x-rays)  Consultations:  Cardiology  ID  Discharge Exam: Vitals:   11/19/16 0720 11/19/16 1309  BP:  124/73  Pulse: 76 75  Resp: (!) 25 (!) 26  Temp:  (!) 97.3 F (36.3 C)    General: alert. No distress  Cardiovascular: s1,s2 rrr Respiratory: CTA BL  Discharge Instructions  Discharge Instructions    Diet - low sodium heart healthy    Complete by:  As directed    Increase activity slowly    Complete by:  As directed      Allergies as of 11/19/2016   No Known Allergies     Medication List    STOP taking these medications   aspirin EC 81 MG tablet   lisinopril 40 MG tablet Commonly known as:  PRINIVIL,ZESTRIL   OVER THE COUNTER MEDICATION   OVER THE COUNTER MEDICATION     TAKE these medications   acetaminophen 325 MG tablet Commonly known as:  TYLENOL Take 2 tablets (650 mg total) by mouth every 6 (six) hours as needed for mild pain (or Fever >/= 101).   amiodarone 200 MG tablet Commonly known as:  PACERONE Take 1 tablet (200 mg total) by mouth daily.   gabapentin 600 MG tablet Commonly known as:  NEURONTIN Take 1 tablet (600 mg total) by mouth 3 (three) times daily.   levalbuterol 0.63 MG/3ML nebulizer solution Commonly known as:  XOPENEX Take 3 mLs (0.63 mg total) by nebulization every 6 (six) hours as needed for wheezing or shortness of breath.   metoprolol tartrate 25 MG tablet Commonly known as:   LOPRESSOR Take 1 tablet (25 mg total) by mouth 2 (two) times daily. What changed:  how much to take   mometasone 220 MCG/INH inhaler Commonly known as:  ASMANEX Inhale 2 puffs into the lungs at bedtime. Rinse mouth well with water after each use   OMEGA 3 PO Take 1 capsule by mouth daily.   rivaroxaban 20 MG Tabs tablet Commonly known as:  XARELTO Take 1 tablet (20 mg total) by mouth daily with supper.   tamsulosin 0.4 MG Caps capsule Commonly known as:  FLOMAX Take 1 capsule (0.4 mg total) by mouth daily. What changed:  when to take this   Tiotropium Bromide-Olodaterol 2.5-2.5 MCG/ACT Aers Commonly known as:  STIOLTO RESPIMAT Inhale 2 puffs into the lungs daily.   Vancomycin 750-5 MG/150ML-% Soln Commonly known as:  VANCOCIN Inject 150 mLs (750 mg total) into the vein every 12 (twelve) hours.  No Known Allergies Contact information for after-discharge care    Destination    [facility] [place] [facility] .   Specialty:  [place] information: [address] [place] [phone]               The results of significant diagnostics from this hospitalization (including imaging, microbiology, ancillary and laboratory) are listed below for reference.    Significant Diagnostic Studies: Dg Chest 1 View  Result Date: 11/14/2016 CLINICAL DATA:  Patient status post thoracentesis. EXAM: CHEST 1 VIEW COMPARISON:  Chest radiograph 11/13/2016; chest CT 11/14/2016 FINDINGS: Monitoring leads overlie the patient. Stable cardiomegaly status post median sternotomy and CABG procedure. Aortic atherosclerosis. Interval decrease in size of now small right pleural effusion. Consolidative opacity within the right mid and lower hemithorax. Unchanged bilateral right-greater-than-left interstitial pulmonary opacities. No pneumothorax. Old left lateral rib fractures. IMPRESSION: Interval decrease in size of now small right pleural  effusion. Underlying consolidation within the right lower hemithorax compatible with previously identified right lung mass. Right-greater-than-left interstitial pulmonary opacities may represent asymmetric pulmonary edema. Electronically Signed   By: Lovey Newcomer M.D.   On: 11/14/2016 10:31   Dg Chest 2 View  Result Date: 11/16/2016 CLINICAL DATA:  Pleural effusion.  Pneumonia EXAM: CHEST  2 VIEW COMPARISON:  Two days ago FINDINGS: Mild reaccumulation of right pleural effusion, but not as extensive as that seen at admission. No apical pneumothorax is seen. By CT there is a large mass at the right base, with asymmetric interstitial thickening. Mediastinal adenopathy. No cardiomegaly. Emphysema. 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 Electronically Signed   By: Monte Fantasia M.D.   On: 11/16/2016 13:09   Ct Chest W Contrast  Result Date: 11/14/2016 CLINICAL DATA:  Assess right-sided pleural effusion. Initial encounter. EXAM: CT CHEST WITH CONTRAST TECHNIQUE: Multidetector CT imaging of the chest was performed during intravenous contrast administration. CONTRAST:  139mL ISOVUE-300 IOPAMIDOL (ISOVUE-300) INJECTION 61% COMPARISON:  Chest radiograph performed 11/13/2016 FINDINGS: Cardiovascular: There is mild aneurysmal dilatation of the ascending thoracic aorta to 4.1 cm in AP dimension. Scattered calcification is noted along the thoracic aorta. Diffuse coronary artery calcifications are seen. The heart remains borderline normal in size. The great vessels demonstrate scattered calcification but are otherwise unremarkable. The patient is status post median sternotomy. Mediastinum/Nodes: Markedly enlarged mediastinal nodes are seen, with a 4.3 cm node at the subcarinal region, right hilar nodes measuring up to 1.9 cm in short axis, right paratracheal nodes measuring up to 2.3 cm in short axis, and right superior mediastinal node measuring 1.7  cm in short axis. The subcarinal node partially encases the esophagus and the mainstem bronchi bilaterally. Multiple large right supraclavicular nodes measure up to 1.8 cm in short axis, compressing the right internal jugular vein. A 1.5 cm supraclavicular node slightly impresses on the right subclavian artery. No definite axillary lymphadenopathy is seen. No pericardial effusion is identified. The thyroid gland is unremarkable. Lungs/Pleura: There is a large right-sided pleural effusion, with associated marked compressive atelectasis. There is occlusion of the bronchi to the right middle and lower lobes, and marked narrowing of the bronchioles to the right upper lobe. There appears to be a large complex diffusely heterogeneous mass at and below the right hilum, measuring approximately 8.8 x 8.1 x 6.5 cm. Multiple areas of central necrosis are noted. A few underlying necrotic nodes are noted at the right hilum. Findings are compatible with primary bronchogenic  malignancy. Underlying postobstructive pneumonia cannot be excluded. Scattered left-sided pulmonary nodules measure up to 7 mm in size, concerning for metastatic disease. Underlying bilateral emphysema is noted. No pneumothorax is identified. Upper Abdomen: The mildly nodular contour of the liver raises concern for hepatic cirrhosis. The visualized portions of the pancreas and spleen are within normal limits. There is slight asymmetric prominence of the left adrenal gland, without a definite mass. Nonspecific perinephric stranding is noted bilaterally. Musculoskeletal: No acute osseous abnormalities are identified. The visualized musculature is unremarkable in appearance. IMPRESSION: 1. Large complex diffusely heterogeneous mass at and below the right hilum, measuring 8.8 x 8.1 x 6.5 cm, with multiple areas of central necrosis. Few underlying necrotic mass at the right hilum. Findings compatible with primary bronchogenic malignancy. Underlying postobstructive  pneumonia cannot be excluded. 2. Markedly enlarged mediastinal nodes, measuring up to 4.3 cm at the subcarinal region. The subcarinal node partially encases the esophagus and mainstem bronchi bilaterally. Right supraclavicular nodes measure up to 1.8 cm in short axis, compressing the right internal jugular vein. 1.5 cm supraclavicular node slightly impresses on the right subclavian artery. 3. Occlusion of the bronchi to the right middle and lower lung lobes, and marked narrowing of the bronchioles to the right upper lobe. 4. Scattered small left-sided pulmonary nodules measure up to 7 mm in size, concerning for metastatic disease. 5. Underlying bilateral emphysema noted. 6. Mild aneurysmal dilatation of the ascending thoracic aorta to 4.1 cm in AP dimension. Recommend annual imaging followup by CTA or MRA. This recommendation follows 2010 ACCF/AHA/AATS/ACR/ASA/SCA/SCAI/SIR/STS/SVM Guidelines for the Diagnosis and Management of Patients with Thoracic Aortic Disease. Circulation. 2010; 121: e266-e369 7. Diffuse coronary artery calcifications seen. 8. Findings of hepatic cirrhosis. 9. Slight asymmetric prominence of the left adrenal gland, without a definite mass. This is nonspecific. Electronically Signed   By: Garald Balding M.D.   On: 11/14/2016 00:55   Dg Chest Port 1 View  Result Date: 11/13/2016 CLINICAL DATA:  Weakness and swelling to both feet. Shortness of breath. EXAM: PORTABLE CHEST 1 VIEW COMPARISON:  None. FINDINGS: Left lung appears hyperexpanded with underlying chronic interstitial changes. There is right base collapse/ consolidation with moderate right pleural effusion. Patient is status post CABG. Bones are diffusely demineralized. Telemetry leads overlie the chest. IMPRESSION: 1. Airspace opacity at the right base with associated moderate right pleural effusion. Given unilateral round IV of findings, neoplasm must be considered. CT chest with contrast may prove helpful to further evaluate.  Electronically Signed   By: Misty Stanley M.D.   On: 11/13/2016 17:36   Dg Swallowing Func-speech Pathology  Result Date: 11/18/2016 Objective Swallowing Evaluation: Type of Study: MBS-Modified Barium Swallow Study Patient Details Name: Mike Becker MRN: [id number] Date of Birth: [birth year] Today's Date: 11/18/2016 Time: SLP Start Time (ACUTE ONLY): 1238-SLP Stop Time (ACUTE ONLY): 1300 SLP Time Calculation (min) (ACUTE ONLY): 22 min Past Medical History: Past Medical History: Diagnosis Date . A-fib ([place])  . COPD (chronic obstructive pulmonary disease) ([place])  . Dyspnea  . Dysrhythmia  . Hypertension  . MI (myocardial infarction) [facility])  Past Surgical History: Past Surgical History: Procedure Laterality Date . EXPLORATION POST OPERATIVE OPEN HEART   . TEE WITHOUT CARDIOVERSION N/A 11/17/2016  Procedure: TRANSESOPHAGEAL ECHOCARDIOGRAM (TEE);  Surgeon: Dixie Dials, MD;  Location: [facility] ENDOSCOPY;  Service: Cardiovascular;  Laterality: N/A; . triple bypass   HPI: 76 y.o. male with a past medical history significant for CAD s/p CABG 2004, uvulectomy, COPD not on home O2, HTN and Afib declined AC  who presents with 1 week progressive cough, weakness, dyspnea, orthopnea.;7/8 s/p thoracentesis removed 1.8L. CXR: right base opacity, rounded and masslike with parapneumonic effusion. Dx with right lung mass No Data Recorded Assessment / Plan / Recommendation CHL IP CLINICAL IMPRESSIONS 11/18/2016 Clinical Impression Patient presents with a moderate pharyngeal phase dysphagia characterized by combination of delayed in swallow initiation and decreased laryngeal closure resulting in penetration before and/or during the swallow. Penetration of nectar thick liquids and pureed solid shallow and cleared quickly with combination of spontaneous dry swallow and cued cough. Penetration of thin liquids however deep and does not consistently clear significantly increasing risk of aspiration. Risk also increases due to intermittent  episodes of post swallow penetration due to presence of vallecular and pyriform sinus residue.  Highly suspect some degree of chronic dysphagia with h/o COPD and dyspnea, exacerbated by acute condition. Recommend downgrading diet to mitigate risk of aspiration with hopeful ability to be able to advance with use of compensatory strategies as functonal reserve for ability to tolerate aspiration improves.  SLP Visit Diagnosis Dysphagia, unspecified (R13.10) Attention and concentration deficit following -- Frontal lobe and executive function deficit following -- Impact on safety and function Moderate aspiration risk;Severe aspiration risk   CHL IP TREATMENT RECOMMENDATION 11/18/2016 Treatment Recommendations Therapy as outlined in treatment plan below   Prognosis 11/18/2016 Prognosis for Safe Diet Advancement Good Barriers to Reach Goals -- Barriers/Prognosis Comment -- CHL IP DIET RECOMMENDATION 11/18/2016 SLP Diet Recommendations Dysphagia 3 (Mech soft) solids;Nectar thick liquid Liquid Administration via Cup;No straw Medication Administration Whole meds with puree Compensations Slow rate;Small sips/bites;Clear throat intermittently Postural Changes Seated upright at 90 degrees   CHL IP OTHER RECOMMENDATIONS 11/18/2016 Recommended Consults -- Oral Care Recommendations Oral care BID Other Recommendations Order thickener from pharmacy;Prohibited food (jello, ice cream, thin soups);Remove water pitcher   CHL IP FOLLOW UP RECOMMENDATIONS 11/18/2016 Follow up Recommendations (No Data)   CHL IP FREQUENCY AND DURATION 11/18/2016 Speech Therapy Frequency (ACUTE ONLY) min 3x week Treatment Duration 2 weeks      CHL IP ORAL PHASE 11/18/2016 Oral Phase WFL Oral - Pudding Teaspoon -- Oral - Pudding Cup -- Oral - Honey Teaspoon -- Oral - Honey Cup -- Oral - Nectar Teaspoon -- Oral - Nectar Cup -- Oral - Nectar Straw -- Oral - Thin Teaspoon -- Oral - Thin Cup -- Oral - Thin Straw -- Oral - Puree -- Oral - Mech Soft -- Oral - Regular --  Oral - Multi-Consistency -- Oral - Pill -- Oral Phase - Comment --  CHL IP PHARYNGEAL PHASE 11/18/2016 Pharyngeal Phase Impaired Pharyngeal- Pudding Teaspoon -- Pharyngeal -- Pharyngeal- Pudding Cup -- Pharyngeal -- Pharyngeal- Honey Teaspoon -- Pharyngeal -- Pharyngeal- Honey Cup -- Pharyngeal -- Pharyngeal- Nectar Teaspoon Delayed swallow initiation-vallecula;Reduced airway/laryngeal closure;Penetration/Aspiration during swallow;Reduced tongue base retraction;Reduced anterior laryngeal mobility;Pharyngeal residue - valleculae;Pharyngeal residue - pyriform Pharyngeal Material enters airway, remains ABOVE vocal cords and not ejected out Pharyngeal- Nectar Cup Delayed swallow initiation-vallecula;Reduced airway/laryngeal closure;Penetration/Aspiration during swallow;Reduced anterior laryngeal mobility;Reduced tongue base retraction;Pharyngeal residue - valleculae;Pharyngeal residue - pyriform Pharyngeal Material enters airway, remains ABOVE vocal cords and not ejected out Pharyngeal- Nectar Straw -- Pharyngeal -- Pharyngeal- Thin Teaspoon -- Pharyngeal -- Pharyngeal- Thin Cup Delayed swallow initiation-pyriform sinuses;Delayed swallow initiation-vallecula;Penetration/Aspiration before swallow;Penetration/Aspiration during swallow;Reduced anterior laryngeal mobility;Reduced tongue base retraction;Pharyngeal residue - pyriform;Pharyngeal residue - valleculae Pharyngeal Material enters airway, CONTACTS cords and not ejected out Pharyngeal- Thin Straw -- Pharyngeal -- Pharyngeal- Puree Delayed swallow initiation-vallecula;Reduced airway/laryngeal closure;Reduced tongue base retraction;Reduced anterior laryngeal  mobility;Pharyngeal residue - valleculae;Pharyngeal residue - pyriform Pharyngeal -- Pharyngeal- Mechanical Soft Delayed swallow initiation-vallecula;Pharyngeal residue - valleculae;Pharyngeal residue - pyriform;Reduced airway/laryngeal closure;Reduced tongue base retraction Pharyngeal -- Pharyngeal- Regular --  Pharyngeal -- Pharyngeal- Multi-consistency -- Pharyngeal -- Pharyngeal- Pill WFL Pharyngeal -- Pharyngeal Comment --  CHL IP CERVICAL ESOPHAGEAL PHASE 11/18/2016 Cervical Esophageal Phase WFL Pudding Teaspoon -- Pudding Cup -- Honey Teaspoon -- Honey Cup -- Nectar Teaspoon -- Nectar Cup -- Nectar Straw -- Thin Teaspoon -- Thin Cup -- Thin Straw -- Puree -- Mechanical Soft -- Regular -- Multi-consistency -- Pill -- Cervical Esophageal Comment -- Gabriel Rainwater MA, CCC-SLP [phone] [place] Leah Meryl 11/18/2016, 3:15 PM              US Thoracentesis Asp Pleural Space W/img Guide  Result Date: 11/14/2016 INDICATION: Smoker with history of COPD, CHF, pneumonia, lung masses/ nodules, cirrhosis, right pleural effusion, dyspnea. Request made for diagnostic and therapeutic right thoracentesis. EXAM: ULTRASOUND GUIDED DIAGNOSTIC AND THERAPEUTIC RIGHT THORACENTESIS MEDICATIONS: None. COMPLICATIONS: None immediate. PROCEDURE: An ultrasound guided thoracentesis was thoroughly discussed with the patient and questions answered. The benefits, risks, alternatives and complications were also discussed. The patient understands and wishes to proceed with the procedure. Written consent was obtained. Ultrasound was performed to localize and mark an adequate pocket of fluid in the right chest. The area was then prepped and draped in the normal sterile fashion. 1% Lidocaine was used for local anesthesia. Under ultrasound guidance a Safe-T-Centesis catheter was introduced. Thoracentesis was performed. The catheter was removed and a dressing applied. FINDINGS: A total of approximately 1.8 liters of turbid, yellow fluid was removed. Samples were sent to the laboratory as requested by the clinical team. IMPRESSION: Successful ultrasound guided diagnostic and therapeutic right thoracentesis yielding 1.8 liters of pleural fluid. Due to patient coughing and this being initial thoracentesis, only the above amount of fluid was removed today. Read  by: Rowe Robert, PA-C Electronically Signed   By: Aletta Edouard M.D.   On: 11/14/2016 10:08    Microbiology: Recent Results (from the past 240 hour(s))  Culture, blood (Routine x 2)     Status: None   Collection Time: 11/13/16  5:10 PM  Result Value Ref Range Status   Specimen Description BLOOD RIGHT ANTECUBITAL  Final   Special Requests   Final    BOTTLES DRAWN AEROBIC AND ANAEROBIC Blood Culture adequate volume   Culture NO GROWTH 5 DAYS  Final   Report Status 11/18/2016 FINAL  Final  Culture, blood (Routine x 2)     Status: Abnormal   Collection Time: 11/13/16  5:25 PM  Result Value Ref Range Status   Specimen Description BLOOD LEFT ANTECUBITAL  Final   Special Requests   Final    BOTTLES DRAWN AEROBIC AND ANAEROBIC Blood Culture adequate volume   Culture  Setup Time   Final    GRAM POSITIVE COCCI IN CLUSTERS ANAEROBIC BOTTLE ONLY CRITICAL RESULT CALLED TO, READ BACK BY AND VERIFIED WITH: [name],PHARMD 11/15/16 1455 [name]    Culture METHICILLIN RESISTANT STAPHYLOCOCCUS AUREUS (A)  Final   Report Status 11/17/2016 FINAL  Final   Organism ID, Bacteria METHICILLIN RESISTANT STAPHYLOCOCCUS AUREUS  Final      Susceptibility   Methicillin resistant staphylococcus aureus - MIC*    CIPROFLOXACIN <=0.5 SENSITIVE Sensitive     ERYTHROMYCIN >=8 RESISTANT Resistant     GENTAMICIN <=0.5 SENSITIVE Sensitive     OXACILLIN >=4 RESISTANT Resistant     TETRACYCLINE <=1 SENSITIVE Sensitive  VANCOMYCIN <=0.5 SENSITIVE Sensitive     TRIMETH/SULFA <=10 SENSITIVE Sensitive     CLINDAMYCIN <=0.25 SENSITIVE Sensitive     RIFAMPIN <=0.5 SENSITIVE Sensitive     Inducible Clindamycin NEGATIVE Sensitive     * METHICILLIN RESISTANT STAPHYLOCOCCUS AUREUS  Blood Culture ID Panel (Reflexed)     Status: Abnormal   Collection Time: 11/13/16  5:25 PM  Result Value Ref Range Status   Enterococcus species NOT DETECTED NOT DETECTED Final   Listeria monocytogenes NOT DETECTED NOT DETECTED Final    Staphylococcus species DETECTED (A) NOT DETECTED Final    Comment: CRITICAL RESULT CALLED TO, READ BACK BY AND VERIFIED WITH: [name], PHARMD 11/15/16 1455 [name]    Staphylococcus aureus DETECTED (A) NOT DETECTED Final    Comment: Methicillin (oxacillin)-resistant Staphylococcus aureus (MRSA). MRSA is predictably resistant to beta-lactam antibiotics (except ceftaroline). Preferred therapy is vancomycin unless clinically contraindicated. Patient requires contact precautions if  hospitalized. CRITICAL RESULT CALLED TO, READ BACK BY AND VERIFIED WITH: [name], PHARMD 11/15/16 [name]    Methicillin resistance DETECTED (A) NOT DETECTED Final    Comment: CRITICAL RESULT CALLED TO, READ BACK BY AND VERIFIED WITH: [name],PHARMD 11/15/16 1455 [name]    Streptococcus species NOT DETECTED NOT DETECTED Final   Streptococcus agalactiae NOT DETECTED NOT DETECTED Final   Streptococcus pneumoniae NOT DETECTED NOT DETECTED Final   Streptococcus pyogenes NOT DETECTED NOT DETECTED Final   Acinetobacter baumannii NOT DETECTED NOT DETECTED Final   Enterobacteriaceae species NOT DETECTED NOT DETECTED Final   Enterobacter cloacae complex NOT DETECTED NOT DETECTED Final   Escherichia coli NOT DETECTED NOT DETECTED Final   Klebsiella oxytoca NOT DETECTED NOT DETECTED Final   Klebsiella pneumoniae NOT DETECTED NOT DETECTED Final   Proteus species NOT DETECTED NOT DETECTED Final   Serratia marcescens NOT DETECTED NOT DETECTED Final   Haemophilus influenzae NOT DETECTED NOT DETECTED Final   Neisseria meningitidis NOT DETECTED NOT DETECTED Final   Pseudomonas aeruginosa NOT DETECTED NOT DETECTED Final   Candida albicans NOT DETECTED NOT DETECTED Final   Candida glabrata NOT DETECTED NOT DETECTED Final   Candida krusei NOT DETECTED NOT DETECTED Final   Candida parapsilosis NOT DETECTED NOT DETECTED Final   Candida tropicalis NOT DETECTED NOT DETECTED Final  Urine culture     Status: Abnormal    Collection Time: 11/13/16  5:34 PM  Result Value Ref Range Status   Specimen Description URINE, RANDOM  Final   Special Requests Normal  Final   Culture <10,000 COLONIES/mL INSIGNIFICANT GROWTH (A)  Final   Report Status 11/14/2016 FINAL  Final  MRSA PCR Screening     Status: Abnormal   Collection Time: 11/13/16 11:00 PM  Result Value Ref Range Status   MRSA by PCR POSITIVE (A) NEGATIVE Final    Comment:        The GeneXpert MRSA Assay (FDA approved for NASAL specimens only), is one component of a comprehensive MRSA colonization surveillance program. It is not intended to diagnose MRSA infection nor to guide or monitor treatment for MRSA infections. RESULT CALLED TO, READ BACK BY AND VERIFIED WITH: RN KARA MCKIBBEN [phone] @[phone]  THANEY   Culture, sputum-assessment     Status: None   Collection Time: 11/14/16  1:13 AM  Result Value Ref Range Status   Specimen Description SPUTUM  Final   Special Requests NONE  Final   Sputum evaluation   Final    Sputum specimen not acceptable for testing.  Please recollect.  Gram Stain Report Called to,Read Back By and Verified With: B RONCALO,RN AT 0740 11/14/16 BY L BENFIELD    Report Status 11/14/2016 FINAL  Final  Culture, body fluid-bottle     Status: None   Collection Time: 11/14/16  9:50 AM  Result Value Ref Range Status   Specimen Description FLUID RIGHT PLEURAL  Final   Special Requests NONE  Final   Culture NO GROWTH 5 DAYS  Final   Report Status 11/19/2016 FINAL  Final  Gram stain     Status: None   Collection Time: 11/14/16  9:50 AM  Result Value Ref Range Status   Specimen Description FLUID RIGHT PLEURAL  Final   Special Requests NONE  Final   Gram Stain   Final    ABUNDANT WBC PRESENT, PREDOMINANTLY PMN RARE GRAM POSITIVE COCCI IN CLUSTERS    Report Status 11/14/2016 FINAL  Final  Culture, blood (routine x 2)     Status: None (Preliminary result)   Collection Time: 11/16/16  5:10 AM  Result Value Ref Range Status    Specimen Description BLOOD LEFT ARM  Final   Special Requests IN PEDIATRIC BOTTLE Blood Culture adequate volume  Final   Culture NO GROWTH 3 DAYS  Final   Report Status PENDING  Incomplete  Culture, blood (routine x 2)     Status: None (Preliminary result)   Collection Time: 11/16/16  5:12 AM  Result Value Ref Range Status   Specimen Description BLOOD BLOOD LEFT FOREARM  Final   Special Requests IN PEDIATRIC BOTTLE Blood Culture adequate volume  Final   Culture NO GROWTH 3 DAYS  Final   Report Status PENDING  Incomplete     Labs: Basic Metabolic Panel:  Recent Labs Lab 11/13/16 2258  11/15/16 0304 11/16/16 0510 11/17/16 0229 11/18/16 0542 11/19/16 0313  NA  --   < > 129* 132* 135 132* 130*  K  --   < > 5.4* 5.6* 4.7 4.6 4.8  CL  --   < > 99* 100* 102 100* 96*  CO2  --   < > 25 25 29 26 30   GLUCOSE  --   < > 115* 102* 120* 103* 102*  BUN  --   < > 49* 34* 27* 22* 19  CREATININE  --   < > 1.08 0.95 0.83 0.84 0.91  CALCIUM  --   < > 8.2* 8.3* 8.1* 7.7* 8.1*  MG 2.1  --   --   --  1.8  --   --   < > = values in this interval not displayed. Liver Function Tests:  Recent Labs Lab 11/13/16 1714 11/15/16 0304  AST 37 31  ALT 25 23  ALKPHOS 186* 139*  BILITOT 1.0 0.5  PROT 6.9 5.6*  ALBUMIN 2.1* 1.6*   No results for input(s): LIPASE, AMYLASE in the last 168 hours. No results for input(s): AMMONIA in the last 168 hours. CBC:  Recent Labs Lab 11/13/16 1714 11/14/16 0552 11/15/16 0304 11/16/16 0510 11/17/16 0229 11/19/16 0313  WBC 17.5* 13.7* 11.2* 10.7* 10.3 11.1*  NEUTROABS 16.0*  --   --   --   --   --   HGB 15.6 13.9 13.6 14.8 13.8 13.4  HCT 48.2 43.7 44.0 46.5 44.7 42.7  MCV 82.1 82.6 84.0 83.8 82.9 82.9  PLT 286 238 238 227 210 218   Cardiac Enzymes:  Recent Labs Lab 11/13/16 1828 11/13/16 2258 11/14/16 0552  TROPONINI 0.07* 0.04* 0.06*   BNP:  BNP (last 3 results)  Recent Labs  11/13/16 1714  BNP 193.6*    ProBNP (last 3 results) No  results for input(s): PROBNP in the last 8760 hours.  CBG: No results for input(s): GLUCAP in the last 168 hours.     SignedKinnie Feil  [facility] 11/19/2016, 3:23 PM

## 2016-11-19 NOTE — Care Management Important Message (Signed)
Important Message  Patient Details  Name: Berkley Cronkright MRN: [id number] Date of Birth: Jun 28, 1940   Medicare Important Message Given:  Yes    Bethena Roys, RN 11/19/2016, 12:41 PM

## 2016-11-19 NOTE — Care Management Note (Addendum)
Case Management Note  Patient Details  Name: Mike Becker MRN: [id number] Date of Birth: 1941/01/05  Subjective/Objective:  Pt presented for Endocarditis- transfer from [facility]. CM worked diligently with the [facility] in regards to home with IV antibiotics. Pt has support of sisters- they chose SNF for patient.                    Action/Plan: CSW following for SNF placement. [facility].Marland Kitchen No further needs from CM @ this time.    Expected Discharge Date:                  Expected Discharge Plan:  Skilled Nursing Facility  In-House Referral:  Clinical Social Work  Discharge planning Services  CM Consult  Post Acute Care Choice:  NA Choice offered to:  NA  DME Arranged:  N/A DME Agency:  NA  HH Arranged:  NA HH Agency:  NA  Status of Service:  Completed, signed off  If discussed at [place] of Stay Meetings, dates discussed:    Additional Comments: 7. S/W [facility] RX # [id number]   1. XARELTO  20 MG DAILY   COVER- YES  CO-PAY- $ 417.00 DEDUCTIBLE NOT MET  TIER- 3 DRUG  PRIOR APPROVAL- NO   2. XARELTO 15 MG BID   COVER- YES  CO-PAY- $834.00  TIER- 3 DRUG  PRIOR APPROVAL- NO   3. RIVAROXABAN: NONE FORMULARY   PREFERRED PHARMACY : Mammie Russian, RN 11/19/2016, 2:44 PM

## 2016-11-19 NOTE — NC FL2 (Signed)
[place] LEVEL OF CARE SCREENING TOOL     IDENTIFICATION  Patient Name: Mike Becker Birthdate: 12-May-1940 Sex: male Admission Date (Current Location): 11/13/2016  [facility] and [place] Number:  Herbalist and Address:  The [place] [place] [address] [place], [place] [address]      Provider Number: [id number]  Attending Physician Name and Address:  Kinnie Feil, MD  Relative Name and Phone Number:  Alee, Katen, [phone]     Current Level of Care: Hospital Recommended Level of Care: [place] Prior Approval Number:    Date Approved/Denied:   PASRR Number: [id number]  Discharge Plan: SNF    Current Diagnoses: Patient Active Problem List   Diagnosis Date Noted  . 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 MRSA bacteremia 11/15/2016  . Pleural effusion on right   . Sepsis ([place]) 11/13/2016  . Community acquired pneumonia of right lower lobe of lung ([place]) 11/13/2016  . Parapneumonic effusion 11/13/2016  . Acute on chronic diastolic CHF (congestive heart failure) ([place]) 11/13/2016  . Elevated troponin 11/13/2016  . AKI (acute kidney injury) ([place]) 11/13/2016  . Hyponatremia 11/13/2016  . Hyperkalemia 11/13/2016  . Essential hypertension 11/13/2016  . AF (paroxysmal atrial fibrillation) ([place]) 11/13/2016  . Coronary artery disease due to lipid rich plaque 11/13/2016  . COPD (chronic obstructive pulmonary disease) ([place]) 11/13/2016    Orientation RESPIRATION BLADDER Height & Weight     Self, Time, Situation, Place  O2 (nasal cannula 3L) Continent Weight: 177 lb 0.5 oz (80.3 kg) Height:  6\' 3"  (190.5 cm)  BEHAVIORAL SYMPTOMS/MOOD NEUROLOGICAL BOWEL NUTRITION STATUS      Continent Diet (dysphagia 3; please see DC summary)  AMBULATORY STATUS COMMUNICATION OF NEEDS Skin   Limited Assist Verbally Normal                       Personal Care Assistance Level of Assistance   Bathing, Feeding, Dressing Bathing Assistance: Maximum assistance Feeding assistance: Independent Dressing Assistance: Limited assistance     Functional Limitations Info  Sight, Hearing, Speech Sight Info: Adequate Hearing Info: Impaired (hearing aid) Speech Info: Adequate    SPECIAL CARE FACTORS FREQUENCY  PT (By licensed PT), OT (By licensed OT)     PT Frequency: 5x/week OT Frequency: 5x/week            Contractures      Additional Factors Info  Code Status, Allergies, Isolation Precautions Code Status Info: Partial Allergies Info: No Known Allergies     Isolation Precautions Info: contact precautions     Current Medications (11/19/2016):  This is the current hospital active medication list Current Facility-Administered Medications  Medication Dose Route Frequency Provider Last Rate Last Dose  . acetaminophen (TYLENOL) tablet 650 mg  650 mg Oral Q6H PRN Danford, Suann Larry, MD       Or  . acetaminophen (TYLENOL) suppository 650 mg  650 mg Rectal Q6H PRN Danford, Suann Larry, MD      . amiodarone (PACERONE) tablet 200 mg  200 mg Oral BID Charolette Forward, MD   200 mg at 11/19/16 0954  . arformoterol (BROVANA) nebulizer solution 15 mcg  15 mcg Nebulization BID Edwin Dada, MD   15 mcg at 11/18/16 1952  . aspirin EC tablet 81 mg  81 mg Oral Daily Edwin Dada, MD   81 mg at 11/19/16 0953  . diphenhydrAMINE (BENADRYL) capsule 25-50 mg  25-50 mg Oral QHS PRN  Edwin Dada, MD   25 mg at 11/14/16 0057  . gabapentin (NEURONTIN) tablet 600 mg  600 mg Oral TID Edwin Dada, MD   600 mg at 11/19/16 0954  . heparin ADULT infusion 100 units/mL (25000 units/253mL sodium chloride 0.45%)  1,650 Units/hr Intravenous Continuous Kris Mouton, RPH 16.5 mL/hr at 11/18/16 1706 1,650 Units/hr at 11/18/16 1706  . levalbuterol (XOPENEX) nebulizer solution 0.63 mg  0.63 mg Nebulization Q6H PRN Wynetta Emery, Clanford L, MD   0.63 mg at 11/18/16 1952  .  metoprolol tartrate (LOPRESSOR) injection 2.5 mg  2.5 mg Intravenous Q4H PRN Charolette Forward, MD   2.5 mg at 11/17/16 1856  . metoprolol tartrate (LOPRESSOR) tablet 25 mg  25 mg Oral BID Charolette Forward, MD   25 mg at 11/19/16 0953  . nicotine (NICODERM CQ - dosed in mg/24 hours) patch 21 mg  21 mg Transdermal Daily Edwin Dada, MD   21 mg at 11/19/16 0956  . nystatin (MYCOSTATIN) 100000 UNIT/ML suspension 500,000 Units  5 mL Oral QID Vianne Bulls, MD   500,000 Units at 11/19/16 0952  . ondansetron (ZOFRAN) tablet 4 mg  4 mg Oral Q6H PRN Danford, Suann Larry, MD       Or  . ondansetron (ZOFRAN) injection 4 mg  4 mg Intravenous Q6H PRN Danford, Suann Larry, MD      . RESOURCE THICKENUP CLEAR   Oral PRN Kinnie Feil, MD      . sodium chloride flush (NS) 0.9 % injection 10-40 mL  10-40 mL Intracatheter Q12H Buriev, Arie Sabina, MD   10 mL at 11/19/16 0956  . sodium chloride flush (NS) 0.9 % injection 10-40 mL  10-40 mL Intracatheter PRN Buriev, Arie Sabina, MD      . tamsulosin (FLOMAX) capsule 0.4 mg  0.4 mg Oral BID Edwin Dada, MD   0.4 mg at 11/19/16 0954  . tiotropium (SPIRIVA) inhalation capsule 18 mcg  18 mcg Inhalation Daily Edwin Dada, MD   18 mcg at 11/18/16 0811  . vancomycin (VANCOCIN) IVPB 750 mg/150 ml premix  750 mg Intravenous Q12H Lyndee Leo, RPH 150 mL/hr at 11/19/16 6568 750 mg at 11/19/16 1275     Discharge Medications: Please see discharge summary for a list of discharge medications.  Relevant Imaging Results:  Relevant Lab Results:   Additional Information SSN: [id number]  Estanislado Emms, LCSW

## 2016-11-19 NOTE — Progress Notes (Signed)
[place] for heparin Indication: atrial fibrillation  No Known Allergies  Patient Measurements: Height: 6\' 3"  (190.5 cm) Weight: 177 lb 0.5 oz (80.3 kg) IBW/kg (Calculated) : 84.5 Heparin Dosing Weight: 80 kg  Assessment: 76yo male on for atrial fibrillation with RVR.  CHADSVASC score is 4. Heparin level therapeutic this AM. CBC is stable. INR 1.15 on 7/8.  No bleeding noted.  Goal of Therapy:  Heparin level 0.3-0.7 units/ml Monitor platelets by anticoagulation protocol: Yes   Plan:  -Continue Heparin gtt at 1650 units/hr -Heparin level  daily wth CBC daily -Follow-up plans for oral anticoagulation  Sloan Leiter, PharmD, BCPS Clinical Pharmacist Clinical Phone 11/19/2016 until 3:30 PM- #[phone] After hours, please call #[phone]  11/19/2016 8:21 AM

## 2016-11-19 NOTE — Clinical Social Work Placement (Signed)
   CLINICAL SOCIAL WORK PLACEMENT  NOTE  Date:  11/19/2016  Patient Details  Name: Mike Becker MRN: [id number] Date of Birth: 1940/07/31  Clinical Social Work is seeking post-discharge placement for this patient at the [place] level of care (*CSW will initial, date and re-position this form in  chart as items are completed):  Yes   Patient/family provided with [place] Work Department's list of facilities offering this level of care within the geographic area requested by the patient (or if unable, by the patient's family).  Yes   Patient/family informed of their freedom to choose among providers that offer the needed level of care, that participate in Medicare, Medicaid or managed care program needed by the patient, have an available bed and are willing to accept the patient.  Yes   Patient/family informed of 's ownership interest in [facility] and [facility], as well as of the fact that they are under no obligation to receive care at these facilities.  PASRR submitted to EDS on 11/19/16     PASRR number received on 11/19/16     Existing PASRR number confirmed on       FL2 transmitted to all facilities in geographic area requested by pt/family on 11/19/16     FL2 transmitted to all facilities within larger geographic area on       Patient informed that his/her managed care company has contracts with or will negotiate with certain facilities, including the following:  [place]     Yes   Patient/family informed of bed offers received.  Patient chooses bed at [facility]     Physician recommends and patient chooses bed at      Patient to be transferred to [facility] on 11/19/16.  Patient to be transferred to facility by [facility]     Patient family notified on 11/19/16 of transfer.  Name of family member notified:  Steward Drone, sister     PHYSICIAN Please prepare  prescriptions     Additional Comment:    _______________________________________________ Estanislado Emms, LCSW 11/19/2016, 3:38 PM

## 2016-11-19 NOTE — Progress Notes (Signed)
MD indicated patient was now agreeable to SNF, though previously refused. CSW began referral process and then patient refused SNF again. Patient indicates he will have support from his sisters at home with administering his IV antibiotics. RNCM aware and supporting with discharge. CSW signing off.  Mike Becker, [place]

## 2016-11-21 LAB — CULTURE, BLOOD (ROUTINE X 2)
CULTURE: NO GROWTH
Culture: NO GROWTH
SPECIAL REQUESTS: ADEQUATE
SPECIAL REQUESTS: ADEQUATE

## 2016-11-30 ENCOUNTER — Inpatient Hospital Stay (HOSPITAL_COMMUNITY): Payer: Medicare PPO

## 2016-11-30 ENCOUNTER — Inpatient Hospital Stay (HOSPITAL_COMMUNITY)
Admission: EM | Admit: 2016-11-30 | Discharge: 2016-12-08 | DRG: 871 | Disposition: E | Payer: Medicare PPO | Attending: Pulmonary Disease | Admitting: Pulmonary Disease

## 2016-11-30 ENCOUNTER — Emergency Department (HOSPITAL_COMMUNITY): Payer: Medicare PPO

## 2016-11-30 ENCOUNTER — Other Ambulatory Visit (HOSPITAL_COMMUNITY): Payer: Medicare PPO

## 2016-11-30 ENCOUNTER — Encounter (HOSPITAL_COMMUNITY): Payer: Self-pay

## 2016-11-30 DIAGNOSIS — J869 Pyothorax without fistula: Secondary | ICD-10-CM | POA: Diagnosis not present

## 2016-11-30 DIAGNOSIS — N179 Acute kidney failure, unspecified: Secondary | ICD-10-CM | POA: Diagnosis present

## 2016-11-30 DIAGNOSIS — R402422 Glasgow coma scale score 9-12, at arrival to emergency department: Secondary | ICD-10-CM | POA: Diagnosis present

## 2016-11-30 DIAGNOSIS — K7460 Unspecified cirrhosis of liver: Secondary | ICD-10-CM

## 2016-11-30 DIAGNOSIS — R1310 Dysphagia, unspecified: Secondary | ICD-10-CM | POA: Diagnosis present

## 2016-11-30 DIAGNOSIS — I4891 Unspecified atrial fibrillation: Secondary | ICD-10-CM | POA: Diagnosis present

## 2016-11-30 DIAGNOSIS — J90 Pleural effusion, not elsewhere classified: Secondary | ICD-10-CM

## 2016-11-30 DIAGNOSIS — Z9911 Dependence on respirator [ventilator] status: Secondary | ICD-10-CM

## 2016-11-30 DIAGNOSIS — Z833 Family history of diabetes mellitus: Secondary | ICD-10-CM

## 2016-11-30 DIAGNOSIS — Y95 Nosocomial condition: Secondary | ICD-10-CM | POA: Diagnosis present

## 2016-11-30 DIAGNOSIS — A419 Sepsis, unspecified organism: Secondary | ICD-10-CM | POA: Diagnosis present

## 2016-11-30 DIAGNOSIS — F17210 Nicotine dependence, cigarettes, uncomplicated: Secondary | ICD-10-CM | POA: Diagnosis present

## 2016-11-30 DIAGNOSIS — I252 Old myocardial infarction: Secondary | ICD-10-CM

## 2016-11-30 DIAGNOSIS — K7030 Alcoholic cirrhosis of liver without ascites: Secondary | ICD-10-CM | POA: Diagnosis not present

## 2016-11-30 DIAGNOSIS — J440 Chronic obstructive pulmonary disease with acute lower respiratory infection: Secondary | ICD-10-CM | POA: Diagnosis present

## 2016-11-30 DIAGNOSIS — I2510 Atherosclerotic heart disease of native coronary artery without angina pectoris: Secondary | ICD-10-CM | POA: Diagnosis present

## 2016-11-30 DIAGNOSIS — J9622 Acute and chronic respiratory failure with hypercapnia: Secondary | ICD-10-CM | POA: Diagnosis present

## 2016-11-30 DIAGNOSIS — Z8249 Family history of ischemic heart disease and other diseases of the circulatory system: Secondary | ICD-10-CM

## 2016-11-30 DIAGNOSIS — E871 Hypo-osmolality and hyponatremia: Secondary | ICD-10-CM | POA: Diagnosis present

## 2016-11-30 DIAGNOSIS — J9600 Acute respiratory failure, unspecified whether with hypoxia or hypercapnia: Secondary | ICD-10-CM | POA: Diagnosis present

## 2016-11-30 DIAGNOSIS — Z7901 Long term (current) use of anticoagulants: Secondary | ICD-10-CM

## 2016-11-30 DIAGNOSIS — J9621 Acute and chronic respiratory failure with hypoxia: Secondary | ICD-10-CM | POA: Diagnosis present

## 2016-11-30 DIAGNOSIS — I10 Essential (primary) hypertension: Secondary | ICD-10-CM | POA: Diagnosis present

## 2016-11-30 DIAGNOSIS — Z9889 Other specified postprocedural states: Secondary | ICD-10-CM | POA: Diagnosis not present

## 2016-11-30 DIAGNOSIS — R918 Other nonspecific abnormal finding of lung field: Secondary | ICD-10-CM | POA: Diagnosis not present

## 2016-11-30 DIAGNOSIS — I712 Thoracic aortic aneurysm, without rupture: Secondary | ICD-10-CM | POA: Diagnosis present

## 2016-11-30 DIAGNOSIS — Z515 Encounter for palliative care: Secondary | ICD-10-CM

## 2016-11-30 DIAGNOSIS — Z951 Presence of aortocoronary bypass graft: Secondary | ICD-10-CM

## 2016-11-30 DIAGNOSIS — E440 Moderate protein-calorie malnutrition: Secondary | ICD-10-CM | POA: Diagnosis present

## 2016-11-30 DIAGNOSIS — Z66 Do not resuscitate: Secondary | ICD-10-CM | POA: Diagnosis present

## 2016-11-30 DIAGNOSIS — Z6823 Body mass index (BMI) 23.0-23.9, adult: Secondary | ICD-10-CM

## 2016-11-30 DIAGNOSIS — C3491 Malignant neoplasm of unspecified part of right bronchus or lung: Secondary | ICD-10-CM | POA: Diagnosis present

## 2016-11-30 DIAGNOSIS — G9341 Metabolic encephalopathy: Secondary | ICD-10-CM | POA: Diagnosis present

## 2016-11-30 DIAGNOSIS — J9690 Respiratory failure, unspecified, unspecified whether with hypoxia or hypercapnia: Secondary | ICD-10-CM

## 2016-11-30 DIAGNOSIS — L03114 Cellulitis of left upper limb: Secondary | ICD-10-CM | POA: Diagnosis present

## 2016-11-30 DIAGNOSIS — J9601 Acute respiratory failure with hypoxia: Secondary | ICD-10-CM | POA: Diagnosis not present

## 2016-11-30 DIAGNOSIS — J189 Pneumonia, unspecified organism: Secondary | ICD-10-CM | POA: Diagnosis present

## 2016-11-30 DIAGNOSIS — R4182 Altered mental status, unspecified: Secondary | ICD-10-CM | POA: Diagnosis present

## 2016-11-30 DIAGNOSIS — Z9689 Presence of other specified functional implants: Secondary | ICD-10-CM

## 2016-11-30 DIAGNOSIS — R739 Hyperglycemia, unspecified: Secondary | ICD-10-CM | POA: Diagnosis present

## 2016-11-30 DIAGNOSIS — J9602 Acute respiratory failure with hypercapnia: Secondary | ICD-10-CM | POA: Diagnosis not present

## 2016-11-30 DIAGNOSIS — D6489 Other specified anemias: Secondary | ICD-10-CM | POA: Diagnosis present

## 2016-11-30 DIAGNOSIS — R6521 Severe sepsis with septic shock: Secondary | ICD-10-CM | POA: Diagnosis present

## 2016-11-30 DIAGNOSIS — R55 Syncope and collapse: Secondary | ICD-10-CM

## 2016-11-30 LAB — COMPREHENSIVE METABOLIC PANEL
ALBUMIN: 1.8 g/dL — AB (ref 3.5–5.0)
ALT: 23 U/L (ref 17–63)
ANION GAP: 6 (ref 5–15)
AST: 29 U/L (ref 15–41)
Alkaline Phosphatase: 172 U/L — ABNORMAL HIGH (ref 38–126)
BILIRUBIN TOTAL: 0.9 mg/dL (ref 0.3–1.2)
BUN: 22 mg/dL — ABNORMAL HIGH (ref 6–20)
CHLORIDE: 93 mmol/L — AB (ref 101–111)
CO2: 31 mmol/L (ref 22–32)
Calcium: 7.8 mg/dL — ABNORMAL LOW (ref 8.9–10.3)
Creatinine, Ser: 1.21 mg/dL (ref 0.61–1.24)
GFR calc Af Amer: >60 mL/min (ref >60)
GFR calc non Af Amer: 56 mL/min — ABNORMAL LOW (ref >60)
GLUCOSE: 182 mg/dL — AB (ref 65–99)
POTASSIUM: 4.7 mmol/L (ref 3.5–5.1)
SODIUM: 130 mmol/L — AB (ref 135–145)
TOTAL PROTEIN: 6.2 g/dL — AB (ref 6.5–8.1)

## 2016-11-30 LAB — I-STAT ARTERIAL BLOOD GAS, ED
ACID-BASE EXCESS: 6 mmol/L — AB (ref 0.0–2.0)
Acid-Base Excess: 7 mmol/L — ABNORMAL HIGH (ref 0.0–2.0)
Bicarbonate: 35.6 mmol/L — ABNORMAL HIGH (ref 20.0–28.0)
Bicarbonate: 34.6 mmol/L — ABNORMAL HIGH (ref 20.0–28.0)
O2 SAT: 93 %
O2 Saturation: 100 %
PH ART: 7.345 — AB (ref 7.350–7.450)
TCO2: 38 mmol/L (ref 0–100)
TCO2: 36 mmol/L (ref 0–100)
pCO2 arterial: 83.9 mmHg (ref 32.0–48.0)
pCO2 arterial: 63.4 mmHg — ABNORMAL HIGH (ref 32.0–48.0)
pH, Arterial: 7.235 — ABNORMAL LOW (ref 7.350–7.450)
pO2, Arterial: 85 mmHg (ref 83.0–108.0)
pO2, Arterial: 288 mmHg — ABNORMAL HIGH (ref 83.0–108.0)

## 2016-11-30 LAB — CBC WITH DIFFERENTIAL/PLATELET
BASOS ABS: 0 10*3/uL (ref 0.0–0.1)
Basophils Relative: 0 %
Eosinophils Absolute: 0.1 10*3/uL (ref 0.0–0.7)
Eosinophils Relative: 1 %
HEMATOCRIT: 33.5 % — AB (ref 39.0–52.0)
Hemoglobin: 10.2 g/dL — ABNORMAL LOW (ref 13.0–17.0)
LYMPHS PCT: 5 %
Lymphs Abs: 0.5 10*3/uL — ABNORMAL LOW (ref 0.7–4.0)
MCH: 25.7 pg — ABNORMAL LOW (ref 26.0–34.0)
MCHC: 30.4 g/dL (ref 30.0–36.0)
MCV: 84.4 fL (ref 78.0–100.0)
MONO ABS: 1 10*3/uL (ref 0.1–1.0)
Monocytes Relative: 9 %
NEUTROS ABS: 9.5 10*3/uL — AB (ref 1.7–7.7)
Neutrophils Relative %: 85 %
Platelets: 217 10*3/uL (ref 150–400)
RBC: 3.97 MIL/uL — ABNORMAL LOW (ref 4.22–5.81)
RDW: 16.3 % — AB (ref 11.5–15.5)
WBC: 11.1 10*3/uL — ABNORMAL HIGH (ref 4.0–10.5)

## 2016-11-30 LAB — URINALYSIS, ROUTINE W REFLEX MICROSCOPIC
BILIRUBIN URINE: NEGATIVE
GLUCOSE, UA: NEGATIVE mg/dL
KETONES UR: NEGATIVE mg/dL
LEUKOCYTES UA: NEGATIVE
Nitrite: NEGATIVE
PROTEIN: 30 mg/dL — AB
Specific Gravity, Urine: 1.013 (ref 1.005–1.030)
pH: 5 (ref 5.0–8.0)

## 2016-11-30 LAB — GLUCOSE, CAPILLARY: GLUCOSE-CAPILLARY: 140 mg/dL — AB (ref 65–99)

## 2016-11-30 LAB — CBC
HCT: 28.5 % — ABNORMAL LOW (ref 39.0–52.0)
Hemoglobin: 8.7 g/dL — ABNORMAL LOW (ref 13.0–17.0)
MCH: 25.8 pg — AB (ref 26.0–34.0)
MCHC: 30.5 g/dL (ref 30.0–36.0)
MCV: 84.6 fL (ref 78.0–100.0)
PLATELETS: 171 10*3/uL (ref 150–400)
RBC: 3.37 MIL/uL — AB (ref 4.22–5.81)
RDW: 16.3 % — ABNORMAL HIGH (ref 11.5–15.5)
WBC: 8.5 10*3/uL (ref 4.0–10.5)

## 2016-11-30 LAB — I-STAT CG4 LACTIC ACID, ED: LACTIC ACID, VENOUS: 1.92 mmol/L — AB (ref 0.5–1.9)

## 2016-11-30 LAB — CORTISOL: Cortisol, Plasma: 22.2 ug/dL

## 2016-11-30 LAB — TROPONIN I: Troponin I: <0.03 ng/mL (ref <0.03)

## 2016-11-30 MED ORDER — MIDAZOLAM HCL 2 MG/2ML IJ SOLN
1.0000 mg | INTRAMUSCULAR | Status: DC | PRN
  Administered 2016-12-01: 1 mg via INTRAVENOUS
  Filled 2016-11-30: qty 2

## 2016-11-30 MED ORDER — FAMOTIDINE IN NACL 20-0.9 MG/50ML-% IV SOLN
20.0000 mg | Freq: Two times a day (BID) | INTRAVENOUS | Status: DC
  Administered 2016-11-30 – 2016-12-02 (×4): 20 mg via INTRAVENOUS
  Filled 2016-11-30 (×5): qty 50

## 2016-11-30 MED ORDER — IPRATROPIUM-ALBUTEROL 0.5-2.5 (3) MG/3ML IN SOLN: 3.0000 mL | Freq: Four times a day (QID) | RESPIRATORY_TRACT | Status: DC | PRN

## 2016-11-30 MED ORDER — ETOMIDATE 2 MG/ML IV SOLN
INTRAVENOUS | Status: DC | PRN
  Administered 2016-11-30: 25 mg via INTRAVENOUS

## 2016-11-30 MED ORDER — ORAL CARE MOUTH RINSE
15.0000 mL | Freq: Four times a day (QID) | OROMUCOSAL | Status: DC
  Administered 2016-11-30 – 2016-12-01 (×4): 15 mL via OROMUCOSAL

## 2016-11-30 MED ORDER — ALBUTEROL SULFATE (2.5 MG/3ML) 0.083% IN NEBU
2.5000 mg | INHALATION_SOLUTION | RESPIRATORY_TRACT | Status: DC | PRN
  Administered 2016-12-02 – 2016-12-05 (×2): 2.5 mg via RESPIRATORY_TRACT
  Filled 2016-11-30 (×2): qty 3

## 2016-11-30 MED ORDER — VANCOMYCIN HCL IN DEXTROSE 1-5 GM/200ML-% IV SOLN
1000.0000 mg | Freq: Once | INTRAVENOUS | Status: AC
Start: 2016-11-30 — End: 2016-11-30
  Administered 2016-11-30: 1000 mg via INTRAVENOUS
  Filled 2016-11-30: qty 200

## 2016-11-30 MED ORDER — VANCOMYCIN HCL IN DEXTROSE 750-5 MG/150ML-% IV SOLN
750.0000 mg | Freq: Two times a day (BID) | INTRAVENOUS | Status: DC
  Administered 2016-12-01 – 2016-12-02 (×4): 750 mg via INTRAVENOUS
  Filled 2016-11-30 (×5): qty 150

## 2016-11-30 MED ORDER — PROTHROMBIN COMPLEX CONC HUMAN 500 UNITS IV KIT
4560.0000 [IU] | PACK | Status: AC
  Administered 2016-12-01: 4560 [IU] via INTRAVENOUS
  Filled 2016-11-30: qty 182.4
  Filled 2016-11-30: qty 182

## 2016-11-30 MED ORDER — INSULIN ASPART 100 UNIT/ML ~~LOC~~ SOLN
2.0000 [IU] | SUBCUTANEOUS | Status: DC
  Administered 2016-11-30: 2 [IU] via SUBCUTANEOUS

## 2016-11-30 MED ORDER — NICOTINE 7 MG/24HR TD PT24
7.0000 mg | MEDICATED_PATCH | Freq: Every day | TRANSDERMAL | Status: DC
  Administered 2016-11-30 – 2016-12-06 (×7): 7 mg via TRANSDERMAL
  Filled 2016-11-30 (×7): qty 1

## 2016-11-30 MED ORDER — CHLORHEXIDINE GLUCONATE 0.12% ORAL RINSE (MEDLINE KIT)
15.0000 mL | Freq: Two times a day (BID) | OROMUCOSAL | Status: DC
  Administered 2016-11-30 – 2016-12-01 (×2): 15 mL via OROMUCOSAL

## 2016-11-30 MED ORDER — ASPIRIN 81 MG PO CHEW: 324.0000 mg | CHEWABLE_TABLET | ORAL | Status: DC

## 2016-11-30 MED ORDER — ASPIRIN 300 MG RE SUPP: 300.0000 mg | RECTAL | Status: DC

## 2016-11-30 MED ORDER — SODIUM CHLORIDE 0.9 % IV BOLUS (SEPSIS)
2500.0000 mL | Freq: Once | INTRAVENOUS | Status: AC
  Administered 2016-11-30: 2500 mL via INTRAVENOUS

## 2016-11-30 MED ORDER — SUCCINYLCHOLINE CHLORIDE 20 MG/ML IJ SOLN
INTRAMUSCULAR | Status: DC | PRN
  Administered 2016-11-30: 120 mg via INTRAVENOUS

## 2016-11-30 MED ORDER — PIPERACILLIN-TAZOBACTAM 3.375 G IVPB
3.3750 g | Freq: Three times a day (TID) | INTRAVENOUS | Status: DC
  Administered 2016-12-01 – 2016-12-06 (×16): 3.375 g via INTRAVENOUS
  Filled 2016-11-30 (×18): qty 50

## 2016-11-30 MED ORDER — FENTANYL CITRATE (PF) 100 MCG/2ML IJ SOLN
25.0000 ug | INTRAMUSCULAR | Status: DC | PRN
  Filled 2016-11-30: qty 2

## 2016-11-30 MED ORDER — AMIODARONE HCL 200 MG PO TABS
200.0000 mg | ORAL_TABLET | Freq: Every day | ORAL | Status: DC
  Administered 2016-12-01: 200 mg
  Filled 2016-11-30: qty 1

## 2016-11-30 MED ORDER — ALBUMIN HUMAN 5 % IV SOLN
12.5000 g | Freq: Once | INTRAVENOUS | Status: AC
  Administered 2016-11-30: 12.5 g via INTRAVENOUS
  Filled 2016-11-30: qty 250

## 2016-11-30 MED ORDER — SODIUM CHLORIDE 0.9 % IV SOLN
250.0000 mL | INTRAVENOUS | Status: DC | PRN
  Administered 2016-12-05: 250 mL via INTRAVENOUS

## 2016-11-30 MED ORDER — PIPERACILLIN-TAZOBACTAM 3.375 G IVPB 30 MIN
3.3750 g | Freq: Once | INTRAVENOUS | Status: AC
  Administered 2016-11-30: 3.375 g via INTRAVENOUS
  Filled 2016-11-30: qty 50

## 2016-11-30 NOTE — Procedures (Signed)
Pt intubated at bedside by MD.  7.5 ETT placed secured 24 @lip .  +ETCo2 color change, =BBS, xray pending.  Pt placed on full vent support. Pt unable to tolerate 63ml VT at this time due to increased peak pressures, decreased VT to 550, pt tolerating better.  Will follow up with [facility].  RT will monitor.

## 2016-11-30 NOTE — Progress Notes (Signed)
Placed pt. On PS per MD.

## 2016-11-30 NOTE — ED Provider Notes (Signed)
[facility] DEPT Provider Note   CSN: [id number] Arrival date & time: 11/07/2016  1651     History   Chief Complaint Chief Complaint  Patient presents with  . Altered Mental Status    HPI Mike Becker is a 76 y.o. male.05 caveat altered mental status history is obtained from records, the patient, from old records and from his sister who accompanies him. Patient markedly less responsive since today. Noted to be febrile at skilled nursing facility. He was treated by EMS with Narcan with supposedly improvement in level of alertness. Patient does suffer from chronic peripheral edema. HPI  Past Medical History:  Diagnosis Date  . A-fib ([place])   . COPD (chronic obstructive pulmonary disease) ([place])   . Dyspnea   . Dysrhythmia   . Hypertension   . MI (myocardial infarction) [facility])     Patient Active Problem List   Diagnosis Date Noted  . 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 MRSA bacteremia 11/15/2016  . Pleural effusion on right   . Sepsis ([place]) 11/13/2016  . Community acquired pneumonia of right lower lobe of lung ([place]) 11/13/2016  . Parapneumonic effusion 11/13/2016  . Acute on chronic diastolic CHF (congestive heart failure) ([place]) 11/13/2016  . Elevated troponin 11/13/2016  . AKI (acute kidney injury) ([place]) 11/13/2016  . Hyponatremia 11/13/2016  . Hyperkalemia 11/13/2016  . Essential hypertension 11/13/2016  . AF (paroxysmal atrial fibrillation) ([place]) 11/13/2016  . Coronary artery disease due to lipid rich plaque 11/13/2016  . COPD (chronic obstructive pulmonary disease) ([place]) 11/13/2016    Past Surgical History:  Procedure Laterality Date  . EXPLORATION POST OPERATIVE OPEN HEART    . TEE WITHOUT CARDIOVERSION N/A 11/17/2016   Procedure: TRANSESOPHAGEAL ECHOCARDIOGRAM (TEE);  Surgeon: Dixie Dials, MD;  Location: [facility] ENDOSCOPY;  Service: Cardiovascular;  Laterality: N/A;  . triple bypass         Home Medications    Prior to Admission  medications   Medication Sig Start Date End Date Taking? Authorizing Provider  acetaminophen (TYLENOL) 325 MG tablet Take 2 tablets (650 mg total) by mouth every 6 (six) hours as needed for mild pain (or Fever >/= 101). 11/19/16   Buriev, Arie Sabina, MD  amiodarone (PACERONE) 200 MG tablet Take 1 tablet (200 mg total) by mouth daily. 11/19/16   Kinnie Feil, MD  gabapentin (NEURONTIN) 600 MG tablet Take 1 tablet (600 mg total) by mouth 3 (three) times daily. 11/19/16   Kinnie Feil, MD  levalbuterol (XOPENEX) 0.63 MG/3ML nebulizer solution Take 3 mLs (0.63 mg total) by nebulization every 6 (six) hours as needed for wheezing or shortness of breath. 11/19/16   Kinnie Feil, MD  metoprolol tartrate (LOPRESSOR) 25 MG tablet Take 1 tablet (25 mg total) by mouth 2 (two) times daily. 11/19/16   Kinnie Feil, MD  mometasone (ASMANEX) 220 MCG/INH inhaler Inhale 2 puffs into the lungs at bedtime. Rinse mouth well with water after each use 11/19/16   Buriev, Arie Sabina, MD  Omega-3 Fatty Acids (OMEGA 3 PO) Take 1 capsule by mouth daily.    [provider]  rivaroxaban (XARELTO) 20 MG TABS tablet Take 1 tablet (20 mg total) by mouth daily with supper. 11/19/16   Kinnie Feil, MD  tamsulosin (FLOMAX) 0.4 MG CAPS capsule Take 1 capsule (0.4 mg total) by mouth daily. 11/19/16   Kinnie Feil, MD  Tiotropium Bromide-Olodaterol (STIOLTO RESPIMAT) 2.5-2.5 MCG/ACT AERS Inhale 2 puffs into the lungs daily. 11/19/16   Rowe Clack  N, MD  Vancomycin (VANCOCIN) 750-5 MG/150ML-% SOLN Inject 150 mLs (750 mg total) into the vein every 12 (twelve) hours. 11/19/16 12/25/16  Kinnie Feil, MD    Family History Family History  Problem Relation Age of Onset  . Diabetes Mother   . Congestive Heart Failure Mother   . Congenital heart disease Mother   . Diabetes Brother     Social History Social History  Substance Use Topics  . Smoking status: Current Every Day Smoker    Packs/day: 1.00     Years: 50.00    Types: Cigarettes  . Smokeless tobacco: Never Used  . Alcohol use No     Allergies   Patient has no known allergies.   Review of Systems Review of Systems  Unable to perform ROS: Mental status change     Physical Exam Updated Vital Signs BP 111/62 (BP Location: Left Arm)   Pulse (!) 104   Temp (!) 100.7 F (38.2 C) (Rectal)   Resp (!) 31   SpO2 99%   Physical Exam  Constitutional:  Ill-appearing. Opens eyes to verbal stimulus. Follow simple commands. Nonverbal. Glasgow Coma Score 10  HENT:  Head: Normocephalic and atraumatic.  Eyes: Pupils are equal, round, and reactive to light. Conjunctivae are normal.  Pupils 2 mm reactive to light  Neck: Neck supple. No tracheal deviation present. No thyromegaly present.  Cardiovascular: Normal rate and regular rhythm.   No murmur heard. Pulmonary/Chest: Effort normal.  Rhonchi, diminished breath sounds right side  Abdominal: Soft. Bowel sounds are normal. He exhibits no distension. There is no tenderness.  Musculoskeletal: Normal range of motion. He exhibits edema. He exhibits no tenderness.  3+ bilateral pitting pretibial edema  Neurological: Coordination normal.  Arousable to verbal stimulus. Nonverbal. Follow some commands, moves all extremities.  Skin: Skin is warm and dry. No rash noted.  Psychiatric: He has a normal mood and affect.  Nursing note and vitals reviewed.    ED Treatments / Results  Labs (all labs ordered are listed, but only abnormal results are displayed) Labs Reviewed  CULTURE, BLOOD (ROUTINE X 2)  CULTURE, BLOOD (ROUTINE X 2)  BLOOD GAS, ARTERIAL  COMPREHENSIVE METABOLIC PANEL  CBC WITH DIFFERENTIAL/PLATELET  URINALYSIS, ROUTINE W REFLEX MICROSCOPIC  I-STAT CG4 LACTIC ACID, ED   Code sepsis called based on Sirs criteria of fever, tachypnea, tachycardia. Source of infection unknown EKG  EKG Interpretation  Date/Time:  Tuesday November 30 2016 16:51:32 EDT Ventricular Rate:   104 PR Interval:    QRS Duration: 152 QT Interval:  380 QTC Calculation: 500 R Axis:   157 Text Interpretation:  Sinus tachycardia Right bundle branch block SINCE LAST TRACING HEART RATE HAS INCREASED Confirmed by Orlie Dakin [id number]) on 12/03/2016 6:36:56 PM       Radiology No results found.  Procedures Procedure Name: Intubation Date/Time: 11/10/2016 5:50 PM Performed by: Orlie Dakin Pre-anesthesia Checklist: Emergency Drugs available Oxygen Delivery Method: Ambu bag Preoxygenation: Pre-oxygenation with 100% oxygen Induction Type: Rapid sequence Ventilation: Mask ventilation without difficulty Laryngoscope Size: Mac and 3 Grade View: Grade III Tube type: Subglottic suction tube Tube size: 7.5 mm Number of attempts: 2 Airway Equipment and Method: Stylet Placement Confirmation: Positive ETCO2 Secured at: 24 cm Tube secured with: ETT holder Dental Injury: Teeth and Oropharynx as per pre-operative assessment       (including critical care time)  Medications Ordered in ED Medications  piperacillin-tazobactam (ZOSYN) IVPB 3.375 g (not administered)  vancomycin (VANCOCIN) IVPB 1000 mg/200 mL premix (not  administered)    Results for orders placed or performed during the hospital encounter of 12/04/2016  Comprehensive metabolic panel  Result Value Ref Range   Sodium 130 (L) 135 - 145 mmol/L   Potassium 4.7 3.5 - 5.1 mmol/L   Chloride 93 (L) 101 - 111 mmol/L   CO2 31 22 - 32 mmol/L   Glucose, Bld 182 (H) 65 - 99 mg/dL   BUN 22 (H) 6 - 20 mg/dL   Creatinine, Ser 1.21 0.61 - 1.24 mg/dL   Calcium 7.8 (L) 8.9 - 10.3 mg/dL   Total Protein 6.2 (L) 6.5 - 8.1 g/dL   Albumin 1.8 (L) 3.5 - 5.0 g/dL   AST 29 15 - 41 U/L   ALT 23 17 - 63 U/L   Alkaline Phosphatase 172 (H) 38 - 126 U/L   Total Bilirubin 0.9 0.3 - 1.2 mg/dL   GFR calc non Af Amer 56 (L) >60 mL/min   GFR calc Af Amer >60 >60 mL/min   Anion gap 6 5 - 15  CBC WITH DIFFERENTIAL  Result Value Ref Range    WBC 11.1 (H) 4.0 - 10.5 K/uL   RBC 3.97 (L) 4.22 - 5.81 MIL/uL   Hemoglobin 10.2 (L) 13.0 - 17.0 g/dL   HCT 33.5 (L) 39.0 - 52.0 %   MCV 84.4 78.0 - 100.0 fL   MCH 25.7 (L) 26.0 - 34.0 pg   MCHC 30.4 30.0 - 36.0 g/dL   RDW 16.3 (H) 11.5 - 15.5 %   Platelets 217 150 - 400 K/uL   Neutrophils Relative % 85 %   Neutro Abs 9.5 (H) 1.7 - 7.7 K/uL   Lymphocytes Relative 5 %   Lymphs Abs 0.5 (L) 0.7 - 4.0 K/uL   Monocytes Relative 9 %   Monocytes Absolute 1.0 0.1 - 1.0 K/uL   Eosinophils Relative 1 %   Eosinophils Absolute 0.1 0.0 - 0.7 K/uL   Basophils Relative 0 %   Basophils Absolute 0.0 0.0 - 0.1 K/uL  Urinalysis, Routine w reflex microscopic  Result Value Ref Range   Color, Urine YELLOW YELLOW   APPearance CLOUDY (A) CLEAR   Specific Gravity, Urine 1.013 1.005 - 1.030   pH 5.0 5.0 - 8.0   Glucose, UA NEGATIVE NEGATIVE mg/dL   Hgb urine dipstick MODERATE (A) NEGATIVE   Bilirubin Urine NEGATIVE NEGATIVE   Ketones, ur NEGATIVE NEGATIVE mg/dL   Protein, ur 30 (A) NEGATIVE mg/dL   Nitrite NEGATIVE NEGATIVE   Leukocytes, UA NEGATIVE NEGATIVE   RBC / HPF 6-30 0 - 5 RBC/hpf   WBC, UA 0-5 0 - 5 WBC/hpf   Bacteria, UA FEW (A) NONE SEEN   Squamous Epithelial / LPF 0-5 (A) NONE SEEN   Mucous PRESENT    Hyaline Casts, UA PRESENT    Granular Casts, UA PRESENT   I-Stat CG4 Lactic Acid, ED  (not at  [facility])  Result Value Ref Range   Lactic Acid, Venous 1.92 (H) 0.5 - 1.9 mmol/L  I-Stat arterial blood gas, ED  Result Value Ref Range   pH, Arterial 7.235 (L) 7.350 - 7.450   pCO2 arterial 83.9 (HH) 32.0 - 48.0 mmHg   pO2, Arterial 85.0 83.0 - 108.0 mmHg   Bicarbonate 35.6 (H) 20.0 - 28.0 mmol/L   TCO2 38 0 - 100 mmol/L   O2 Saturation 93.0 %   Acid-Base Excess 6.0 (H) 0.0 - 2.0 mmol/L   Patient temperature HIDE    Sample type ARTERIAL    Comment  NOTIFIED PHYSICIAN   I-Stat arterial blood gas, ED  Result Value Ref Range   pH, Arterial 7.345 (L) 7.350 - 7.450   pCO2 arterial 63.4 (H)  32.0 - 48.0 mmHg   pO2, Arterial 288.0 (H) 83.0 - 108.0 mmHg   Bicarbonate 34.6 (H) 20.0 - 28.0 mmol/L   TCO2 36 0 - 100 mmol/L   O2 Saturation 100.0 %   Acid-Base Excess 7.0 (H) 0.0 - 2.0 mmol/L   Patient temperature HIDE    Sample type ARTERIAL    Dg Chest 1 View  Result Date: 11/14/2016 CLINICAL DATA:  Patient status post thoracentesis. EXAM: CHEST 1 VIEW COMPARISON:  Chest radiograph 11/13/2016; chest CT 11/14/2016 FINDINGS: Monitoring leads overlie the patient. Stable cardiomegaly status post median sternotomy and CABG procedure. Aortic atherosclerosis. Interval decrease in size of now small right pleural effusion. Consolidative opacity within the right mid and lower hemithorax. Unchanged bilateral right-greater-than-left interstitial pulmonary opacities. No pneumothorax. Old left lateral rib fractures. IMPRESSION: Interval decrease in size of now small right pleural effusion. Underlying consolidation within the right lower hemithorax compatible with previously identified right lung mass. Right-greater-than-left interstitial pulmonary opacities may represent asymmetric pulmonary edema. Electronically Signed   By: Lovey Newcomer M.D.   On: 11/14/2016 10:31   Dg Chest 2 View  Result Date: 11/16/2016 CLINICAL DATA:  Pleural effusion.  Pneumonia EXAM: CHEST  2 VIEW COMPARISON:  Two days ago FINDINGS: Mild reaccumulation of right pleural effusion, but not as extensive as that seen at admission. No apical pneumothorax is seen. By CT there is a large mass at the right base, with asymmetric interstitial thickening. Mediastinal adenopathy. No cardiomegaly. Emphysema. 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 Electronically Signed   By: Monte Fantasia M.D.   On: 11/16/2016 13:09   Ct Chest W Contrast  Result Date: 11/14/2016 CLINICAL DATA:  Assess right-sided pleural effusion. Initial encounter. EXAM: CT CHEST WITH CONTRAST TECHNIQUE:  Multidetector CT imaging of the chest was performed during intravenous contrast administration. CONTRAST:  146m ISOVUE-300 IOPAMIDOL (ISOVUE-300) INJECTION 61% COMPARISON:  Chest radiograph performed 11/13/2016 FINDINGS: Cardiovascular: There is mild aneurysmal dilatation of the ascending thoracic aorta to 4.1 cm in AP dimension. Scattered calcification is noted along the thoracic aorta. Diffuse coronary artery calcifications are seen. The heart remains borderline normal in size. The great vessels demonstrate scattered calcification but are otherwise unremarkable. The patient is status post median sternotomy. Mediastinum/Nodes: Markedly enlarged mediastinal nodes are seen, with a 4.3 cm node at the subcarinal region, right hilar nodes measuring up to 1.9 cm in short axis, right paratracheal nodes measuring up to 2.3 cm in short axis, and right superior mediastinal node measuring 1.7 cm in short axis. The subcarinal node partially encases the esophagus and the mainstem bronchi bilaterally. Multiple large right supraclavicular nodes measure up to 1.8 cm in short axis, compressing the right internal jugular vein. A 1.5 cm supraclavicular node slightly impresses on the right subclavian artery. No definite axillary lymphadenopathy is seen. No pericardial effusion is identified. The thyroid gland is unremarkable. Lungs/Pleura: There is a large right-sided pleural effusion, with associated marked compressive atelectasis. There is occlusion of the bronchi to the right middle and lower lobes, and marked narrowing of the bronchioles to the right upper lobe. There appears to be a large complex diffusely heterogeneous mass at and below the right hilum, measuring approximately 8.8 x 8.1 x 6.5 cm. Multiple areas of central necrosis are noted. A few underlying  necrotic nodes are noted at the right hilum. 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 Underlying postobstructive pneumonia cannot be excluded.  Scattered left-sided pulmonary nodules measure up to 7 mm in size, concerning for metastatic disease. Underlying bilateral emphysema is noted. No pneumothorax is identified. Upper Abdomen: The mildly nodular contour of the liver raises concern for hepatic cirrhosis. The visualized portions of the pancreas and spleen are within normal limits. There is slight asymmetric prominence of the left adrenal gland, without a definite mass. Nonspecific perinephric stranding is noted bilaterally. Musculoskeletal: No acute osseous abnormalities are identified. The visualized musculature is unremarkable in appearance. IMPRESSION: 1. Large complex diffusely heterogeneous mass at and below the right hilum, measuring 8.8 x 8.1 x 6.5 cm, with multiple areas of central necrosis. Few underlying necrotic mass at the right hilum. Findings compatible with primary bronchogenic malignancy. Underlying postobstructive pneumonia cannot be excluded. 2. Markedly enlarged mediastinal nodes, measuring up to 4.3 cm at the subcarinal region. The subcarinal node partially encases the esophagus and mainstem bronchi bilaterally. Right supraclavicular nodes measure up to 1.8 cm in short axis, compressing the right internal jugular vein. 1.5 cm supraclavicular node slightly impresses on the right subclavian artery. 3. Occlusion of the bronchi to the right middle and lower lung lobes, and marked narrowing of the bronchioles to the right upper lobe. 4. Scattered small left-sided pulmonary nodules measure up to 7 mm in size, concerning for metastatic disease. 5. Underlying bilateral emphysema noted. 6. Mild aneurysmal dilatation of the ascending thoracic aorta to 4.1 cm in AP dimension. Recommend annual imaging followup by CTA or MRA. This recommendation follows 2010 ACCF/AHA/AATS/ACR/ASA/SCA/SCAI/SIR/STS/SVM Guidelines for the Diagnosis and Management of Patients with Thoracic Aortic Disease. Circulation. 2010; 121: e266-e369 7. Diffuse coronary artery  calcifications seen. 8. Findings of hepatic cirrhosis. 9. Slight asymmetric prominence of the left adrenal gland, without a definite mass. This is nonspecific. Electronically Signed   By: Garald Balding M.D.   On: 11/14/2016 00:55   Dg Chest Port 1 View  Result Date: 11/19/2016 CLINICAL DATA:  76 year old male status post endotracheal tube placement. EXAM: PORTABLE CHEST 1 VIEW COMPARISON:  Chest x-ray a 11/16/2016. FINDINGS: An endotracheal tube is in place with tip 6.6 cm above the carina. A nasogastric tube is seen extending into the stomach, however, the tip of the nasogastric tube extends below the lower margin of the image. There is a right upper extremity PICC with tip terminating in the superior cavoatrial junction. Large right pleural effusion with near complete opacification of the right hemithorax. Patchy opacities in the left lung base, concerning for areas of atelectasis and/or airspace consolidation. Coarse interstitial markings. No evidence of pulmonary edema. Heart size is normal. Aortic atherosclerosis. Status post median sternotomy for CABG. IMPRESSION: 1. Support apparatus, as above. 2. Large right pleural effusion with near complete passive atelectasis in the right lung. 3. Areas of atelectasis and/or airspace consolidation in the left lower lobe. 4. Aortic atherosclerosis. Electronically Signed   By: Vinnie Langton M.D.   On: 12/05/2016 19:02   Dg Chest Port 1 View  Result Date: 11/13/2016 CLINICAL DATA:  Weakness and swelling to both feet. Shortness of breath. EXAM: PORTABLE CHEST 1 VIEW COMPARISON:  None. FINDINGS: Left lung appears hyperexpanded with underlying chronic interstitial changes. There is right base collapse/ consolidation with moderate right pleural effusion. Patient is status post CABG. Bones are diffusely demineralized. Telemetry leads overlie the chest. IMPRESSION: 1. Airspace opacity at the right base with associated moderate right pleural effusion. Given unilateral  round IV of findings, neoplasm must be considered. CT chest with contrast may prove helpful to further evaluate. Electronically Signed   By: Misty Stanley M.D.   On: 11/13/2016 17:36   Dg Abd Portable 1 View  Result Date: 12/02/2016 CLINICAL DATA:  OGT placement. EXAM: PORTABLE ABDOMEN - 1 VIEW COMPARISON:  None. FINDINGS: Tip and side port of the enteric tube below the diaphragm in the distal stomach. No bowel dilatation to suggest obstruction. Surgical clips in the right upper quadrant of the abdomen likely from cholecystectomy. IMPRESSION: Tip and side port of the enteric tube below the diaphragm in the stomach. Electronically Signed   By: Jeb Levering M.D.   On: 11/20/2016 19:00   Dg Swallowing Func-speech Pathology  Result Date: 11/18/2016 Objective Swallowing Evaluation: Type of Study: MBS-Modified Barium Swallow Study Patient Details Name: Nolberto Cheuvront MRN: [id number] Date of Birth: 08-12-1940 Today's Date: 11/18/2016 Time: SLP Start Time (ACUTE ONLY): 1238-SLP Stop Time (ACUTE ONLY): 1300 SLP Time Calculation (min) (ACUTE ONLY): 22 min Past Medical History: Past Medical History: Diagnosis Date . A-fib ([place])  . COPD (chronic obstructive pulmonary disease) ([place])  . Dyspnea  . Dysrhythmia  . Hypertension  . MI (myocardial infarction) [facility])  Past Surgical History: Past Surgical History: Procedure Laterality Date . EXPLORATION POST OPERATIVE OPEN HEART   . TEE WITHOUT CARDIOVERSION N/A 11/17/2016  Procedure: TRANSESOPHAGEAL ECHOCARDIOGRAM (TEE);  Surgeon: Dixie Dials, MD;  Location: [facility] ENDOSCOPY;  Service: Cardiovascular;  Laterality: N/A; . triple bypass   HPI: 75 y.o. male with a past medical history significant for CAD s/p CABG 2004, uvulectomy, COPD not on home O2, HTN and Afib declined AC who presents with 1 week progressive cough, weakness, dyspnea, orthopnea.;7/8 s/p thoracentesis removed 1.8L. CXR: right base opacity, rounded and masslike with parapneumonic effusion. Dx with right lung  mass No Data Recorded Assessment / Plan / Recommendation CHL IP CLINICAL IMPRESSIONS 11/18/2016 Clinical Impression Patient presents with a moderate pharyngeal phase dysphagia characterized by combination of delayed in swallow initiation and decreased laryngeal closure resulting in penetration before and/or during the swallow. Penetration of nectar thick liquids and pureed solid shallow and cleared quickly with combination of spontaneous dry swallow and cued cough. Penetration of thin liquids however deep and does not consistently clear significantly increasing risk of aspiration. Risk also increases due to intermittent episodes of post swallow penetration due to presence of vallecular and pyriform sinus residue.  Highly suspect some degree of chronic dysphagia with h/o COPD and dyspnea, exacerbated by acute condition. Recommend downgrading diet to mitigate risk of aspiration with hopeful ability to be able to advance with use of compensatory strategies as functonal reserve for ability to tolerate aspiration improves.  SLP Visit Diagnosis Dysphagia, unspecified (R13.10) Attention and concentration deficit following -- Frontal lobe and executive function deficit following -- Impact on safety and function Moderate aspiration risk;Severe aspiration risk   CHL IP TREATMENT RECOMMENDATION 11/18/2016 Treatment Recommendations Therapy as outlined in treatment plan below   Prognosis 11/18/2016 Prognosis for Safe Diet Advancement Good Barriers to Reach Goals -- Barriers/Prognosis Comment -- CHL IP DIET RECOMMENDATION 11/18/2016 SLP Diet Recommendations Dysphagia 3 (Mech soft) solids;Nectar thick liquid Liquid Administration via Cup;No straw Medication Administration Whole meds with puree Compensations Slow rate;Small sips/bites;Clear throat intermittently Postural Changes Seated upright at 90 degrees   CHL IP OTHER RECOMMENDATIONS 11/18/2016 Recommended Consults -- Oral Care Recommendations Oral care BID Other Recommendations  Order thickener from pharmacy;Prohibited food (jello, ice cream, thin soups);Remove water pitcher   CHL IP  FOLLOW UP RECOMMENDATIONS 11/18/2016 Follow up Recommendations (No Data)   CHL IP FREQUENCY AND DURATION 11/18/2016 Speech Therapy Frequency (ACUTE ONLY) min 3x week Treatment Duration 2 weeks      CHL IP ORAL PHASE 11/18/2016 Oral Phase WFL Oral - Pudding Teaspoon -- Oral - Pudding Cup -- Oral - Honey Teaspoon -- Oral - Honey Cup -- Oral - Nectar Teaspoon -- Oral - Nectar Cup -- Oral - Nectar Straw -- Oral - Thin Teaspoon -- Oral - Thin Cup -- Oral - Thin Straw -- Oral - Puree -- Oral - Mech Soft -- Oral - Regular -- Oral - Multi-Consistency -- Oral - Pill -- Oral Phase - Comment --  CHL IP PHARYNGEAL PHASE 11/18/2016 Pharyngeal Phase Impaired Pharyngeal- Pudding Teaspoon -- Pharyngeal -- Pharyngeal- Pudding Cup -- Pharyngeal -- Pharyngeal- Honey Teaspoon -- Pharyngeal -- Pharyngeal- Honey Cup -- Pharyngeal -- Pharyngeal- Nectar Teaspoon Delayed swallow initiation-vallecula;Reduced airway/laryngeal closure;Penetration/Aspiration during swallow;Reduced tongue base retraction;Reduced anterior laryngeal mobility;Pharyngeal residue - valleculae;Pharyngeal residue - pyriform Pharyngeal Material enters airway, remains ABOVE vocal cords and not ejected out Pharyngeal- Nectar Cup Delayed swallow initiation-vallecula;Reduced airway/laryngeal closure;Penetration/Aspiration during swallow;Reduced anterior laryngeal mobility;Reduced tongue base retraction;Pharyngeal residue - valleculae;Pharyngeal residue - pyriform Pharyngeal Material enters airway, remains ABOVE vocal cords and not ejected out Pharyngeal- Nectar Straw -- Pharyngeal -- Pharyngeal- Thin Teaspoon -- Pharyngeal -- Pharyngeal- Thin Cup Delayed swallow initiation-pyriform sinuses;Delayed swallow initiation-vallecula;Penetration/Aspiration before swallow;Penetration/Aspiration during swallow;Reduced anterior laryngeal mobility;Reduced tongue base  retraction;Pharyngeal residue - pyriform;Pharyngeal residue - valleculae Pharyngeal Material enters airway, CONTACTS cords and not ejected out Pharyngeal- Thin Straw -- Pharyngeal -- Pharyngeal- Puree Delayed swallow initiation-vallecula;Reduced airway/laryngeal closure;Reduced tongue base retraction;Reduced anterior laryngeal mobility;Pharyngeal residue - valleculae;Pharyngeal residue - pyriform Pharyngeal -- Pharyngeal- Mechanical Soft Delayed swallow initiation-vallecula;Pharyngeal residue - valleculae;Pharyngeal residue - pyriform;Reduced airway/laryngeal closure;Reduced tongue base retraction Pharyngeal -- Pharyngeal- Regular -- Pharyngeal -- Pharyngeal- Multi-consistency -- Pharyngeal -- Pharyngeal- Pill WFL Pharyngeal -- Pharyngeal Comment --  [facility] IP CERVICAL ESOPHAGEAL PHASE 11/18/2016 Cervical Esophageal Phase WFL Pudding Teaspoon -- Pudding Cup -- Honey Teaspoon -- Honey Cup -- Nectar Teaspoon -- Nectar Cup -- Nectar Straw -- Thin Teaspoon -- Thin Cup -- Thin Straw -- Puree -- Mechanical Soft -- Regular -- Multi-consistency -- Pill -- Cervical Esophageal Comment -- Gabriel Rainwater MA, CCC-SLP [phone] [place] Leah Meryl 11/18/2016, 3:15 PM              US Thoracentesis Asp Pleural Space W/img Guide  Result Date: 11/14/2016 INDICATION: Smoker with history of COPD, CHF, pneumonia, lung masses/ nodules, cirrhosis, right pleural effusion, dyspnea. Request made for diagnostic and therapeutic right thoracentesis. EXAM: ULTRASOUND GUIDED DIAGNOSTIC AND THERAPEUTIC RIGHT THORACENTESIS MEDICATIONS: None. COMPLICATIONS: None immediate. PROCEDURE: An ultrasound guided thoracentesis was thoroughly discussed with the patient and questions answered. The benefits, risks, alternatives and complications were also discussed. The patient understands and wishes to proceed with the procedure. Written consent was obtained. Ultrasound was performed to localize and mark an adequate pocket of fluid in the right chest. The area was  then prepped and draped in the normal sterile fashion. 1% Lidocaine was used for local anesthesia. Under ultrasound guidance a Safe-T-Centesis catheter was introduced. Thoracentesis was performed. The catheter was removed and a dressing applied. FINDINGS: A total of approximately 1.8 liters of turbid, yellow fluid was removed. Samples were sent to the laboratory as requested by the clinical team. IMPRESSION: Successful ultrasound guided diagnostic and therapeutic right thoracentesis yielding 1.8 liters of pleural fluid. Due to patient coughing and this being initial thoracentesis,  only the above amount of fluid was removed today. Read by: Rowe Robert, PA-C Electronically Signed   By: Aletta Edouard M.D.   On: 11/14/2016 10:08   Initial Impression / Assessment and Plan / ED Course  I have reviewed the triage vital signs and the nursing notes.  Pertinent labs & imaging results that were available during my care of the patient were reviewed by me and considered in my medical decision making (see chart for details).     Patient was intubated by me due to hypercarbic respiratory failure with altered mental status 7 PM patient is awake and alert on the ventilator. Blood pressure is improved in mental status has improved after treatment with intravenous antibiotics and intravenous fluid bolus 30 mL/kg  Sepsis - Repeat Assessment  Performed at:    7pm  Vitals     Blood pressure 100/60, pulse 81, temperature (!) 100.7 F (38.2 C), temperature source Rectal, resp. rate (!) 0, height _0  (1.905 m), SpO2 100 %.  Heart:     Tachycardic  Lungs:    Wheezing and Diminished breath sounds right side  Capillary Refill:   <2 sec  Peripheral Pulse:   Radial pulse palpable  Skin:  coolDry  Intensive care consult called. Dr. Jamal Collin will see patient in ED and arrange for admission  Final Clinical Impressions(s) / ED Diagnoses  Diagnosis #1 septic shock  #2 right sided  pleural effusion  Final diagnoses:    None  #3 hypercarbic respiratory failure #3 anemia  #4acute kidney injury  #5 hyperglycemia  New Prescriptions New Prescriptions   No medications on file  .CRITICAL CARE Performed by: Orlie Dakin Total critical care time: 65 minutes Critical care time was exclusive of separately billable procedures and treating other patients. Critical care was necessary to treat or prevent imminent or life-threatening deterioration. Critical care was time spent personally by me on the following activities: development of treatment plan with patient and/or surrogate as well as nursing, discussions with consultants, evaluation of patient's response to treatment, examination of patient, obtaining history from patient or surrogate, ordering and performing treatments and interventions, ordering and review of laboratory studies, ordering and review of radiographic studies, pulse oximetry and re-evaluation of patient's condition.  Orlie Dakin, MD 11/22/2016 [phone]

## 2016-11-30 NOTE — ED Notes (Signed)
X RAY called about the need for Portable post intubation X RAY

## 2016-11-30 NOTE — Progress Notes (Signed)
Pharmacy Antibiotic Note Mike Becker is a 76 y.o. male admitted on 11/10/2016 with concern sepsis. Pt with history of MRSA bacteremia and was on vancomycin 750 mg IV every 12 hours prior to arrival for treatment. Last dose received on 7/24 at 0900. Pharmacy has been consulted for Zosyn and vancomycin dosing.  Plan: 1. Zosyn 3.375g IV q8h (4 hour infusion).  2. Continue vancomycin 750 mg IV every 12 hours starting on 7/25 at 0900 as received a 1 gram dose on 7/24 pm  3. Likely needs vancomycin trough in next 48 - 72 hours    Height: 6\' 3"  (190.5 cm) IBW/kg (Calculated) : 84.5  Temp (24hrs), Avg:100.7 F (38.2 C), Min:100.7 F (38.2 C), Max:100.7 F (38.2 C)   Recent Labs Lab 11/21/2016 1710 11/07/2016 1721  WBC 11.1*  --   CREATININE 1.21  --   LATICACIDVEN  --  1.92*    Estimated Creatinine Clearance: 58.1 mL/min (by C-G formula based on SCr of 1.21 mg/dL).    No Known Allergies  Antimicrobials this admission: 7/24 Zosyn >>  7/8 vancomycin >> 8/18  Microbiology results: 7/24 BCx: px 7/7 BCx: MRSA  Thank you for allowing pharmacy to be a part of this patient's care.  Vincenza Hews, PharmD, BCPS 11/10/2016, 7:48 PM

## 2016-11-30 NOTE — ED Triage Notes (Signed)
Patient here from [facility] for [facility].  Patient can follow commands but very lethargic.  Staff at SNF states he was completing therapy for Sepsis on a prior Encounter, receiving Vanc form previously placed PICC line.  Patient on non rebreather for EMS of Sats in the low 80s.  Reviewed 2mg  Narcan dues to pin point pupils.  Moderate response but overall no improvement.

## 2016-11-30 NOTE — ED Notes (Signed)
Post intubation patient is alert and able to nod head yes and no.  Wheezing noted throughout lung fields.  Patient was initially hypotensive post intubation but after 2500 mL fluids patient's BP is 885O-277A systolic with a MAP of >12.  Family at bedside waiting on Critical care bed

## 2016-11-30 NOTE — H&P (Signed)
PULMONARY / CRITICAL CARE MEDICINE   Name: Mike Becker MRN: [id number] DOB: 11-23-1940    ADMISSION DATE:  11/24/2016 CONSULTATION DATE:  11/13/2016  REFERRING MD:  Dr. Winfred Leeds  CHIEF COMPLAINT:  AMS  HISTORY OF PRESENT ILLNESS:   76 yo male with PMH CAD s/p CABG 2004, COPD, HTN, Afib on xarelto and recent hospitalization 7/7-7/13 for MRSA bacteremia. Presents from SNF with AMS. History per family and chart review. Had some slight confusion this am, occasionally repeating things, but otherwise was doing well today when was working with OT this afternoon and had LOC while up to urinate. Was having difficulty breathing and became more confused/lethargic and had additional episode of LOC that lasted a few minutes. Did not hit head either time. EMS on arrival placed patient on nonbreather for sats in low 80s and gave narcan 2mg  without improvement. In the ED was intubated for hypercarbic respiratory failure. Of note, family states that patient recently quit smoking and had Nicotine patch 14mg  last admission that he had not been getting for the past few days and was restarted abruptly.   PAST MEDICAL HISTORY :  He  has a past medical history of A-fib ([place]); COPD (chronic obstructive pulmonary disease) ([place]); Dyspnea; Dysrhythmia; Hypertension; and MI (myocardial infarction) ([place]).  PAST SURGICAL HISTORY: He  has a past surgical history that includes Exploration post operative open heart; triple bypass; and TEE without cardioversion (N/A, 11/17/2016).  No Known Allergies  No current facility-administered medications on file prior to encounter.    Current Outpatient Prescriptions on File Prior to Encounter  Medication Sig  . acetaminophen (TYLENOL) 325 MG tablet Take 2 tablets (650 mg total) by mouth every 6 (six) hours as needed for mild pain (or Fever >/= 101).  Marland Kitchen amiodarone (PACERONE) 200 MG tablet Take 1 tablet (200 mg total) by mouth daily.  Marland Kitchen gabapentin (NEURONTIN) 600 MG tablet Take  1 tablet (600 mg total) by mouth 3 (three) times daily.  Marland Kitchen levalbuterol (XOPENEX) 0.63 MG/3ML nebulizer solution Take 3 mLs (0.63 mg total) by nebulization every 6 (six) hours as needed for wheezing or shortness of breath.  . metoprolol tartrate (LOPRESSOR) 25 MG tablet Take 1 tablet (25 mg total) by mouth 2 (two) times daily.  . mometasone (ASMANEX) 220 MCG/INH inhaler Inhale 2 puffs into the lungs at bedtime. Rinse mouth well with water after each use  . Omega-3 Fatty Acids (OMEGA 3 PO) Take 1 capsule by mouth daily.  . rivaroxaban (XARELTO) 20 MG TABS tablet Take 1 tablet (20 mg total) by mouth daily with supper.  . tamsulosin (FLOMAX) 0.4 MG CAPS capsule Take 1 capsule (0.4 mg total) by mouth daily.  . Tiotropium Bromide-Olodaterol (STIOLTO RESPIMAT) 2.5-2.5 MCG/ACT AERS Inhale 2 puffs into the lungs daily.  . Vancomycin (VANCOCIN) 750-5 MG/150ML-% SOLN Inject 150 mLs (750 mg total) into the vein every 12 (twelve) hours.    FAMILY HISTORY:  His indicated that the status of his mother is unknown. He indicated that the status of his brother is unknown.    SOCIAL HISTORY: He  reports that he has been smoking Cigarettes.  He has a 50.00 pack-year smoking history. He has never used smokeless tobacco. He reports that he does not drink alcohol or use drugs.  Quit cigarettes on 11/13/16  REVIEW OF SYSTEMS:   Per HPI. + Cough. Denies pain. Per family more swelling of legs and hands.   SUBJECTIVE:  Patient denies pain by shaking his head to indicate no.  VITAL SIGNS:  BP 100/60   Pulse 81   Temp (!) 100.7 F (38.2 C) (Rectal)   Resp (!) 0   Ht 6\' 3"  (1.905 m)   SpO2 100%   HEMODYNAMICS:    VENTILATOR SETTINGS: Vent Mode: PRVC FiO2 (%):  [60 %-100 %] 60 % Set Rate:  [20 bmp] 20 bmp Vt Set:  [550 mL] 550 mL PEEP:  [5 cmH20] 5 cmH20 Plateau Pressure:  [21 cmH20] 21 cmH20  INTAKE / OUTPUT: No intake/output data recorded.  PHYSICAL EXAMINATION: General:  Ill appearing edlerly man,  laying in bed on vent, in NAD Neuro:  Awake, follows commands HEENT:  [place], AT. PERRL. ETT in place Cardiovascular:  RRR, no murmurs Lungs:  Diminished breath sounds on R, coarse breath sounds on L Abdomen:  Distended, nontender, + bowel sounds Musculoskeletal:  3+ pitting edema b/l in LE, 1+ pitting edema in UE b/l. Skin:  L LE with area of erythema and warmth. Chronic venous changes b/l on ankles.  LABS:  BMET  Recent Labs Lab 11/10/2016 1710  NA 130*  K 4.7  CL 93*  CO2 31  BUN 22*  CREATININE 1.21  GLUCOSE 182*    Electrolytes  Recent Labs Lab 12/07/2016 1710  CALCIUM 7.8*    CBC  Recent Labs Lab 12/03/2016 1710  WBC 11.1*  HGB 10.2*  HCT 33.5*  PLT 217    Coag's No results for input(s): APTT, INR in the last 168 hours.  Sepsis Markers  Recent Labs Lab 11/27/2016 1721  LATICACIDVEN 1.92*    ABG  Recent Labs Lab 12/01/2016 1729 12/03/2016 1828  PHART 7.235* 7.345*  PCO2ART 83.9* 63.4*  PO2ART 85.0 288.0*    Liver Enzymes  Recent Labs Lab 11/24/2016 1710  AST 29  ALT 23  ALKPHOS 172*  BILITOT 0.9  ALBUMIN 1.8*    Cardiac Enzymes No results for input(s): TROPONINI, PROBNP in the last 168 hours.  Glucose No results for input(s): GLUCAP in the last 168 hours.  Imaging Dg Chest Port 1 View  Result Date: 12/05/2016 CLINICAL DATA:  76 year old male status post endotracheal tube placement. EXAM: PORTABLE CHEST 1 VIEW COMPARISON:  Chest x-ray a 11/16/2016. FINDINGS: An endotracheal tube is in place with tip 6.6 cm above the carina. A nasogastric tube is seen extending into the stomach, however, the tip of the nasogastric tube extends below the lower margin of the image. There is a right upper extremity PICC with tip terminating in the superior cavoatrial junction. Large right pleural effusion with near complete opacification of the right hemithorax. Patchy opacities in the left lung base, concerning for areas of atelectasis and/or airspace  consolidation. Coarse interstitial markings. No evidence of pulmonary edema. Heart size is normal. Aortic atherosclerosis. Status post median sternotomy for CABG. IMPRESSION: 1. Support apparatus, as above. 2. Large right pleural effusion with near complete passive atelectasis in the right lung. 3. Areas of atelectasis and/or airspace consolidation in the left lower lobe. 4. Aortic atherosclerosis. Electronically Signed   By: Vinnie Langton M.D.   On: 11/08/2016 19:02   Dg Abd Portable 1 View  Result Date: 11/28/2016 CLINICAL DATA:  OGT placement. EXAM: PORTABLE ABDOMEN - 1 VIEW COMPARISON:  None. FINDINGS: Tip and side port of the enteric tube below the diaphragm in the distal stomach. No bowel dilatation to suggest obstruction. Surgical clips in the right upper quadrant of the abdomen likely from cholecystectomy. IMPRESSION: Tip and side port of the enteric tube below the diaphragm in the stomach. Electronically Signed  By: Jeb Levering M.D.   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 Enlarged mediastinal nodes. Scattered small left-sided pulmonary nodules. TEE 7/11> 55-60%, wall motion normal, small highly mobile vegetations on aortic and mitral valves CXR 7/24> Large right pleural effusion with near complete passive atelectasis in the right lung. Areas of atelectasis and/or airspace consolidation in the left lower lobe.  CULTURES: Blood cultures 7/24 >  ANTIBIOTICS: Vanc (on since prior to admit) 7/24> Zosyn 7/24 >   SIGNIFICANT EVENTS: 7/24 admitted, intubated   LINES/TUBES: R UE PICC 7/13 > ETT 7/24 > NGT 7/24 > PIV  DISCUSSION: 76yo male with recently hospitalization for MRSA bacteremia and endocarditis who presented with AMS 2/2 hypercarbia, acute respiratory failure from a large R pleural effusion in the setting  of known R lung mass.  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 chronic P:   Monitor BMET, UOP  GASTROINTESTINAL A:   ?Cirrhosis GI ppx P:   RUQ ultrasound with doppler famotidine  HEMATOLOGIC A:   Acute anemia Concern for hemothorax DVT ppx P:  Monitor CBC Hold anticoagulation SCDs  INFECTIOUS A:   MRSA bacteremia and endocarditis Large R pleural effusion Cellulitis P:   Vanc/zosyn Follow BCx Sputum Cx Trend WBC, fever curve Plan for diagnostic thoracentesis   ENDOCRINE A:   Hyperglycemia   P:   SSI  NEUROLOGIC A:   Altered mental status, improved P:   RASS goal: +2 PRN fentayl PRN versed Will need to clarify code status once extubated   FAMILY  - Updates: Siblings updated at bedside.   - Inter-disciplinary family meet or Palliative Care meeting due by:  12/07/16  Bufford Lope, DO PGY-2, [facility] 12/07/2016 7:00 PM

## 2016-12-01 ENCOUNTER — Inpatient Hospital Stay (HOSPITAL_COMMUNITY): Payer: Medicare PPO

## 2016-12-01 DIAGNOSIS — J9601 Acute respiratory failure with hypoxia: Secondary | ICD-10-CM

## 2016-12-01 DIAGNOSIS — J9602 Acute respiratory failure with hypercapnia: Secondary | ICD-10-CM

## 2016-12-01 DIAGNOSIS — R55 Syncope and collapse: Secondary | ICD-10-CM

## 2016-12-01 LAB — POCT I-STAT 3, ART BLOOD GAS (G3+)
Acid-Base Excess: 6 mmol/L — ABNORMAL HIGH (ref 0.0–2.0)
BICARBONATE: 31.2 mmol/L — AB (ref 20.0–28.0)
O2 SAT: 98 %
PCO2 ART: 51.1 mmHg — AB (ref 32.0–48.0)
PO2 ART: 117 mmHg — AB (ref 83.0–108.0)
Patient temperature: 101.8
TCO2: 33 mmol/L (ref 0–100)
pH, Arterial: 7.401 (ref 7.350–7.450)

## 2016-12-01 LAB — GLUCOSE, CAPILLARY
GLUCOSE-CAPILLARY: 74 mg/dL (ref 65–99)
GLUCOSE-CAPILLARY: 88 mg/dL (ref 65–99)
GLUCOSE-CAPILLARY: 85 mg/dL (ref 65–99)
Glucose-Capillary: 77 mg/dL (ref 65–99)
Glucose-Capillary: 83 mg/dL (ref 65–99)
Glucose-Capillary: 89 mg/dL (ref 65–99)
Glucose-Capillary: 92 mg/dL (ref 65–99)

## 2016-12-01 LAB — BASIC METABOLIC PANEL
ANION GAP: 6 (ref 5–15)
BUN: 18 mg/dL (ref 6–20)
CALCIUM: 7.6 mg/dL — AB (ref 8.9–10.3)
CO2: 30 mmol/L (ref 22–32)
Chloride: 99 mmol/L — ABNORMAL LOW (ref 101–111)
Creatinine, Ser: 1.05 mg/dL (ref 0.61–1.24)
GFR calc Af Amer: >60 mL/min (ref >60)
GFR calc non Af Amer: >60 mL/min (ref >60)
GLUCOSE: 80 mg/dL (ref 65–99)
Potassium: 4.4 mmol/L (ref 3.5–5.1)
Sodium: 135 mmol/L (ref 135–145)

## 2016-12-01 LAB — CBC
HEMATOCRIT: 26.4 % — AB (ref 39.0–52.0)
HEMATOCRIT: 28.7 % — AB (ref 39.0–52.0)
Hemoglobin: 8 g/dL — ABNORMAL LOW (ref 13.0–17.0)
Hemoglobin: 8.7 g/dL — ABNORMAL LOW (ref 13.0–17.0)
MCH: 25.4 pg — ABNORMAL LOW (ref 26.0–34.0)
MCH: 25.5 pg — AB (ref 26.0–34.0)
MCHC: 30.3 g/dL (ref 30.0–36.0)
MCHC: 30.3 g/dL (ref 30.0–36.0)
MCV: 83.8 fL (ref 78.0–100.0)
MCV: 84.2 fL (ref 78.0–100.0)
Platelets: 149 10*3/uL — ABNORMAL LOW (ref 150–400)
Platelets: 169 10*3/uL (ref 150–400)
RBC: 3.15 MIL/uL — AB (ref 4.22–5.81)
RBC: 3.41 MIL/uL — ABNORMAL LOW (ref 4.22–5.81)
RDW: 16.2 % — AB (ref 11.5–15.5)
RDW: 16.3 % — AB (ref 11.5–15.5)
WBC: 7.3 10*3/uL (ref 4.0–10.5)
WBC: 7.7 10*3/uL (ref 4.0–10.5)

## 2016-12-01 LAB — PROTIME-INR
INR: 1.44
INR: 1.73
PROTHROMBIN TIME: 20.4 s — AB (ref 11.4–15.2)
Prothrombin Time: 17.7 seconds — ABNORMAL HIGH (ref 11.4–15.2)

## 2016-12-01 LAB — APTT: aPTT: 34 seconds (ref 24–36)

## 2016-12-01 LAB — ECHOCARDIOGRAM COMPLETE
Ao-asc: 37 cm
CHL CUP MV DEC (S): 166
EERAT: 8.94
EWDT: 166 ms
FS: 36 % (ref 28–44)
Height: 75 in
IVS/LV PW RATIO, ED: 0.9
LA ID, A-P, ES: 37 mm
LA vol A4C: 28.9 ml
LADIAMINDEX: 1.66 cm/m2
LEFT ATRIUM END SYS DIAM: 37 mm
LV E/e' medial: 8.94
LV PW d: 14.3 mm — AB (ref 0.6–1.1)
LV TDI E'LATERAL: 12.3
LV e' LATERAL: 12.3 cm/s
LVEEAVG: 8.94
LVOT area: 3.46 cm2
LVOTD: 21 mm
MV Peak grad: 5 mmHg
MV pk A vel: 123 m/s
MV pk E vel: 110 m/s
TDI e' medial: 8.7
Weight: 3287.5 oz

## 2016-12-01 LAB — BODY FLUID CELL COUNT WITH DIFFERENTIAL
Lymphs, Fluid: 1 %
MONOCYTE-MACROPHAGE-SEROUS FLUID: 4 % — AB (ref 50–90)
Neutrophil Count, Fluid: 95 % — ABNORMAL HIGH (ref 0–25)
WBC FLUID: 11310 uL — AB (ref 0–1000)

## 2016-12-01 LAB — TYPE AND SCREEN
ABO/RH(D): A NEG
Antibody Screen: NEGATIVE

## 2016-12-01 LAB — PROTEIN, PLEURAL OR PERITONEAL FLUID

## 2016-12-01 LAB — HEPARIN LEVEL (UNFRACTIONATED): Heparin Unfractionated: 1.56 IU/mL — ABNORMAL HIGH (ref 0.30–0.70)

## 2016-12-01 LAB — LACTATE DEHYDROGENASE, PLEURAL OR PERITONEAL FLUID: LD FL: 1753 U/L — AB (ref 3–23)

## 2016-12-01 LAB — ABO/RH: ABO/RH(D): A NEG

## 2016-12-01 LAB — LACTIC ACID, PLASMA: Lactic Acid, Venous: 2.2 mmol/L (ref 0.5–1.9)

## 2016-12-01 MED ORDER — ORAL CARE MOUTH RINSE
15.0000 mL | Freq: Two times a day (BID) | OROMUCOSAL | Status: DC
  Administered 2016-12-01 – 2016-12-05 (×7): 15 mL via OROMUCOSAL

## 2016-12-01 MED ORDER — SODIUM CHLORIDE 0.9% FLUSH
10.0000 mL | Freq: Two times a day (BID) | INTRAVENOUS | Status: DC
  Administered 2016-12-01 – 2016-12-02 (×3): 10 mL
  Administered 2016-12-04: 30 mL
  Administered 2016-12-05 – 2016-12-06 (×2): 10 mL

## 2016-12-01 MED ORDER — CHLORHEXIDINE GLUCONATE CLOTH 2 % EX PADS
6.0000 | MEDICATED_PAD | Freq: Every day | CUTANEOUS | Status: DC
  Administered 2016-12-01 – 2016-12-04 (×4): 6 via TOPICAL

## 2016-12-01 MED ORDER — SODIUM CHLORIDE 0.9% FLUSH: 10.0000 mL | INTRAVENOUS | Status: DC | PRN

## 2016-12-01 MED ORDER — HEPARIN (PORCINE) IN NACL 100-0.45 UNIT/ML-% IJ SOLN
1350.0000 [IU]/h | INTRAMUSCULAR | Status: DC
  Administered 2016-12-01: 1350 [IU]/h via INTRAVENOUS
  Filled 2016-12-01 (×3): qty 250

## 2016-12-01 MED ORDER — FENTANYL CITRATE (PF) 100 MCG/2ML IJ SOLN
INTRAMUSCULAR | Status: AC
  Administered 2016-12-01: 50 ug
  Filled 2016-12-01: qty 2

## 2016-12-01 MED ORDER — PERFLUTREN LIPID MICROSPHERE
1.0000 mL | INTRAVENOUS | Status: AC | PRN
  Administered 2016-12-01: 6 mL via INTRAVENOUS
  Filled 2016-12-01: qty 10

## 2016-12-01 MED ORDER — AMIODARONE HCL 200 MG PO TABS
200.0000 mg | ORAL_TABLET | Freq: Every day | ORAL | Status: DC
  Filled 2016-12-01 (×2): qty 1

## 2016-12-01 MED ORDER — FENTANYL CITRATE (PF) 100 MCG/2ML IJ SOLN
12.5000 ug | INTRAMUSCULAR | Status: DC | PRN
  Administered 2016-12-01 – 2016-12-02 (×3): 12.5 ug via INTRAVENOUS
  Filled 2016-12-01 (×3): qty 2

## 2016-12-01 MED ORDER — GABAPENTIN 600 MG PO TABS
600.0000 mg | ORAL_TABLET | Freq: Three times a day (TID) | ORAL | Status: DC
  Filled 2016-12-01 (×6): qty 1

## 2016-12-01 NOTE — Progress Notes (Signed)
Resident paged regarding pt c/o discomfort to LUE with noticeably increased swelling and bruising noted. Arm appears red/purple and warm to touch. IV in LFA flushed with good blood return and no infiltration noted. Md will be by to take a look at pt arm. Pt also extremely agitated about being NPO stating "I understand the risks and still want to eat and drink". Pt informed that doctor will be by to assess his arm and he can discuss his diet status with him at that time. Pt agreeable and resting quietly in bed. VSS, will continue to monitor.

## 2016-12-01 NOTE — Procedures (Signed)
Extubation Procedure Note  Patient Details:   Name: Mike Becker DOB: [birth year] MRN: [id number]   Airway Documentation:  Airway 7.5 mm (Active)  Secured at (cm) 24 cm 12/01/2016 12:44 PM  Measured From Lips 12/01/2016 12:44 PM  Secured Location Right 12/01/2016 12:44 PM  Secured By [employer] 12/01/2016 12:44 PM  Tube Holder Repositioned Yes 12/01/2016 12:44 PM  Site Condition Cool;Dry 12/01/2016 12:44 PM    Evaluation  O2 sats: stable throughout Complications: No apparent complications Patient did tolerate procedure well. Bilateral Breath Sounds: Clear, Diminished   Yes   Patient extubated per MD order. Patient is able to speak and vital signs are stable. Patient currently on 6L [place] with sat of 96%. RT will continue to monitor.   Mylani Gentry Clyda Greener 12/01/2016, 2:25 PM

## 2016-12-01 NOTE — Progress Notes (Addendum)
Paged by RN to evaluate patient for swelling and bruising of LUE.  Does have new bruising at antecubital region with associated pain and erythema of anterior forearm.  No warmth or fluctuance appreciated and no palpable cords.  Both upper extremities have 2+ pitting edema.  Currently on vanc and zosyn and heparin drip.  Will mark area of erythema and monitor overnight for worsening.   Also, patient requiring frequent suctioning since extubation with poor cough and requesting diet. Has not been evaluated by SLP.  Last admission required dysphagia III diet.  Patient understands risks of initiating diet. Discussed with Dr. Derrek Gu who agreed with diet.  SLP to see 7/26 for formal evaluation.   Lavanya Roa L. Rosalyn Gess, [place] Resident PGY-2 12/01/2016 11:54 PM

## 2016-12-01 NOTE — Progress Notes (Signed)
Spoke to patient and family regarding goals of care. All agree on Limited code with no intubation or CPR. Okay with medications and BiPAP/CPAP if needed.   Mike Becker, AGACNP-BC [facility]  Pgr: [phone]  PCCM Pgr: [phone]

## 2016-12-01 NOTE — Progress Notes (Signed)
Post-Thoracentesis, Patient weaned well and has good tidal volumes, following commands. Will extubate. RT notified and orders placed.   Hayden Pedro, AGACNP-BC [facility]  Pgr: [phone]  PCCM Pgr: [phone]

## 2016-12-01 NOTE — Progress Notes (Signed)
  Echocardiogram 2D Echocardiogram has been performed.  Randa Lynn Parris [place] 12/01/2016, 4:23 PM

## 2016-12-01 NOTE — Procedures (Signed)
Thoracentesis Procedure Note  Pre-operative Diagnosis: Right Side Pleural Effusion   Post-operative Diagnosis: Same.   Indications: Right Side Pleural Effusion Cytology  Procedure Details  Consent: Informed consent was obtained. Risks of the procedure were discussed including: infection, bleeding, pain, pneumothorax.  Under sterile conditions the patient was positioned. Betadine solution and sterile drapes were utilized.  1% plain lidocaine was used to anesthetize the rib space. Fluid was obtained without any difficulties and minimal blood loss.  A dressing was applied to the wound and wound care instructions were provided.   Findings 600 ml of dark red pleural fluid was obtained. A sample was sent to Pathology for cytogenetics, flow, and cell counts, as well as for infection analysis.  Complications:  None; patient tolerated the procedure well.          Condition: stable  Plan A follow up chest x-ray was ordered.   Hayden Pedro, AGACNP-BC [facility]  Pgr: [phone]  PCCM Pgr: [phone]

## 2016-12-01 NOTE — Progress Notes (Signed)
[facility] Progress Note Patient Name: Mike Becker DOB: 1941-01-30 MRN: [id number]   Date of Service  12/01/2016  HPI/Events of Note  Lactate 2.62m up slightly from 7/24. Clinically improved, doubt that this is a significant change  eICU Interventions  Repeat lactate in am to insure no further elevation     Intervention Category Major Interventions: Acid-Base disturbance - evaluation and management  BYRUM,ROBERT S. 12/01/2016, 4:31 PM

## 2016-12-01 NOTE — Progress Notes (Signed)
PULMONARY / CRITICAL CARE MEDICINE   Name: Mike Becker MRN: [id number] DOB: [birth year]    ADMISSION DATE:  11/08/2016 CONSULTATION DATE:  11/13/2016  REFERRING MD:  Dr. Winfred Leeds  CHIEF COMPLAINT:  AMS  HISTORY OF PRESENT ILLNESS:   76 yo male with PMH CAD s/p CABG 2004, COPD, HTN, Afib on xarelto and recent hospitalization 7/7-7/13 for MRSA bacteremia/endocarditis.   Presents 7/24 from SNF with AMS and multiple syncopal episodes. Upon arrival hypoxic in the low 80s with increased WOB, intubated in the ED. ABG 7.235/83.9/85.   SUBJECTIVE:  No events overnight. Currently weaning    VITAL SIGNS: BP (!) 93/58   Pulse 80   Temp 98.7 F (37.1 C) (Oral)   Resp (!) 56   Ht 6\' 3"  (1.905 m)   Wt 93.2 kg (205 lb 7.5 oz)   SpO2 97%   BMI 25.68 kg/m   HEMODYNAMICS:    VENTILATOR SETTINGS: Vent Mode: BIPAP FiO2 (%):  [50 %-100 %] 50 % Set Rate:  [20 bmp] 20 bmp Vt Set:  [550 mL] 550 mL PEEP:  [5 cmH20] 5 cmH20 Pressure Support:  [10 cmH20] 10 cmH20 Plateau Pressure:  [21 cmH20-22 cmH20] 22 cmH20  INTAKE / OUTPUT: I/O last 3 completed shifts: In: 552.4 [I.V.:20; IV Piggyback:532.4] Out: 1135 [Urine:685; Emesis/NG output:450]  PHYSICAL EXAMINATION: General: Chronically ill appearing adult male, no distress  Neuro:  Alert, awake, follows commands, moves extremities  HEENT:  PERRL. ETT in place Cardiovascular:  RRR, no murmurs Lungs:  Diminished breath sounds at right bases, no wheeze/crackles  Abdomen:  Distended, nontender, + bowel sounds Musculoskeletal:  3+ pitting edema b/l in LE, 1+ pitting edema in UE b/l. Skin:  Chronic venous changes b/l on ankles  LABS:  BMET  Recent Labs Lab 11/18/2016 1710  NA 130*  K 4.7  CL 93*  CO2 31  BUN 22*  CREATININE 1.21  GLUCOSE 182*    Electrolytes  Recent Labs Lab 11/19/2016 1710  CALCIUM 7.8*    CBC  Recent Labs Lab 11/12/2016 1710 12/01/2016 2210 12/01/16 0209  WBC 11.1* 8.5 7.3  HGB 10.2* 8.7* 8.0*   HCT 33.5* 28.5* 26.4*  PLT 217 171 149*    Coag's  Recent Labs Lab 12/01/16 0008 12/01/16 0209  APTT 34  --   INR 1.73 1.44    Sepsis Markers  Recent Labs Lab 12/07/2016 1721  LATICACIDVEN 1.92*    ABG  Recent Labs Lab 11/28/2016 1729 11/19/2016 1828 12/01/16 0002  PHART 7.235* 7.345* 7.401  PCO2ART 83.9* 63.4* 51.1*  PO2ART 85.0 288.0* 117.0*    Liver Enzymes  Recent Labs Lab 12/04/2016 1710  AST 29  ALT 23  ALKPHOS 172*  BILITOT 0.9  ALBUMIN 1.8*    Cardiac Enzymes  Recent Labs Lab 11/27/2016 2210  TROPONINI <0.03    Glucose  Recent Labs Lab 11/18/2016 2036 12/01/16 0004 12/01/16 0406 12/01/16 0741  GLUCAP 140* 92 74 77    Imaging Dg Chest Port 1 View  Result Date: 11/23/2016 CLINICAL DATA:  Acute respiratory failure EXAM: PORTABLE CHEST 1 VIEW COMPARISON:  Film from earlier in the same day FINDINGS: Right-sided PICC line, endotracheal tube and nasogastric catheter are again seen and stable. Cardiac shadow is stable. Aortic calcifications are again seen. Left lung remains well aerated with mild left basilar atelectasis stable from the prior study. The large right pleural effusion is again identified with significant compression upon the right lung. The overall appearance is stable from the prior study. IMPRESSION: No  change from the prior exam. Large right pleural effusion remains. Electronically Signed   By: Inez Catalina M.D.   On: 11/12/2016 21:03   Dg Chest Port 1 View  Result Date: 12/01/2016 CLINICAL DATA:  76 year old male status post endotracheal tube placement. EXAM: PORTABLE CHEST 1 VIEW COMPARISON:  Chest x-ray a 11/16/2016. FINDINGS: An endotracheal tube is in place with tip 6.6 cm above the carina. A nasogastric tube is seen extending into the stomach, however, the tip of the nasogastric tube extends below the lower margin of the image. There is a right upper extremity PICC with tip terminating in the superior cavoatrial junction. Large  right pleural effusion with near complete opacification of the right hemithorax. Patchy opacities in the left lung base, concerning for areas of atelectasis and/or airspace consolidation. Coarse interstitial markings. No evidence of pulmonary edema. Heart size is normal. Aortic atherosclerosis. Status post median sternotomy for CABG. IMPRESSION: 1. Support apparatus, as above. 2. Large right pleural effusion with near complete passive atelectasis in the right lung. 3. Areas of atelectasis and/or airspace consolidation in the left lower lobe. 4. Aortic atherosclerosis. Electronically Signed   By: Vinnie Langton M.D.   On: 12/07/2016 19:02   Dg Abd Portable 1 View  Result Date: 11/12/2016 CLINICAL DATA:  OGT placement. EXAM: PORTABLE ABDOMEN - 1 VIEW COMPARISON:  None. FINDINGS: Tip and side port of the enteric tube below the diaphragm in the distal stomach. No bowel dilatation to suggest obstruction. Surgical clips in the right upper quadrant of the abdomen likely from cholecystectomy. IMPRESSION: Tip and side port of the enteric tube below the diaphragm in the stomach. Electronically Signed   By: Jeb Levering M.D.   On: 11/29/2016 19:00   US Abdomen Limited Ruq  Result Date: 12/01/2016 CLINICAL DATA:  Cirrhosis of the liver EXAM: ULTRASOUND ABDOMEN LIMITED RIGHT UPPER QUADRANT COMPARISON:  11/28/2016 radiograph FINDINGS: Gallbladder: Surgically absent Common bile duct: Diameter: 3.2 mm Liver: Increased echogenicity.  No focal abnormality IMPRESSION: 1. Surgical absence of the gallbladder.  No biliary dilatation 2. Increased hepatic echogenicity suggesting fatty infiltration or hepatocellular disease. No focal abnormality. Electronically Signed   By: Donavan Foil M.D.   On: 12/01/2016 00:29     STUDIES:  CT chest 7/8> Large complex diffusely heterogeneous mass at R hilium 8.8 x 8.1 x 6.5 cm, with multiple areas of central necrosis c/w primary bronchogenic malignancy. Enlarged mediastinal nodes.  Scattered small left-sided pulmonary nodules. TEE 7/11 > 55-60%, wall motion normal, small highly mobile vegetations on aortic and mitral valves CXR 7/24 > Large right pleural effusion with near complete passive atelectasis in the right lung. Areas of atelectasis and/or airspace consolidation in the left lower lobe.  CULTURES: Blood 7/24 >> Sputum 7/24 >>    ANTIBIOTICS: Vanc 7/8 > with stop date of 8/18  Zosyn 7/24 >   SIGNIFICANT EVENTS: 7/24 > admitted, intubated   LINES/TUBES: R UE PICC 7/13 > ETT 7/24 > NGT 7/24 > PIV  DISCUSSION: 76yo male with recently hospitalization for MRSA bacteremia and endocarditis who presented with AMS 2/2 hypercarbia, acute respiratory failure from a large R pleural effusion in the setting of known R lung mass.  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 Gwenette Greet of [place] in [place], COPD (FEV1/FVC of  32%) P:   Currently on CPAP 5/5 Oxygen goal 88-92% ? Thoracentesis > chronic right side pleural effusion will perform bedside U/S ? 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 > last echo with EF 60% and vegetations on AV, MV, TV  H/O Thoracic aortic aneurysm, AFib (on Xarelto), CAD s/p CABG 2004   P:  Cardiac Monitoring  Echo pending  Maintain MAP > 65  Continue home amiodarone Hold home lopressor  Xarelto/Heparin held as concern for hemothorax   RENAL A:   Mild AKI Hyponatremia ?SIADH given tumor burden  P:   Trend BMP  Replace electrolytes as needed   GASTROINTESTINAL A:   ?Cirrhosis  -noted on CT chest, Viral Hep panel negative GI ppx P:   RUQ ultrasound with doppler pending   TF PPI   HEMATOLOGIC A:   Acute anemia Concern for hemothorax DVT ppx P:  Trend CBC Hold anticoagulation SCDs  INFECTIOUS A:   MRSA bacteremia and endocarditis Cellulitis ?  -WBC 7.7, Tmax 100.7 P:   Trend Fever and WBC curve  Follow Culture Data  Continue Vancomycin and Zosyn   ENDOCRINE A:   Hyperglycemia   P:   Trend Glucose  SSI  NEUROLOGIC A:   Altered mental status > improved P:   RASS goal: 0/-1  PRN fentanyl and versed   FAMILY  - Updates: Son updated at bedside, patient confirms recent DNI however this was reversed in ED, expresses wishes for a no trach, spoke with family about continued discussion of code status.   - Inter-disciplinary family meet or Palliative Care meeting due by: 12/07/16  CC Time: 110 minutes   Hayden Pedro, AGACNP-BC [place]  Pgr: [phone]  PCCM Pgr: [phone]

## 2016-12-01 NOTE — Progress Notes (Signed)
ANTICOAGULATION CONSULT NOTE - Initial Consult  Pharmacy Consult for heparin (holding xarelto) Indication: atrial fibrillation  No Known Allergies  Patient Measurements: Height: 6\' 3"  (190.5 cm) Weight: 205 lb 7.5 oz (93.2 kg) IBW/kg (Calculated) : 84.5 Heparin Dosing Weight: 92 kg  Vital Signs: Temp: 98.1 F (36.7 C) (07/25 1207) Temp Source: Oral (07/25 1207) BP: 96/57 (07/25 1244) Pulse Rate: 86 (07/25 1244)  Labs:  Recent Labs  12/03/2016 1710 11/29/2016 2210 12/01/16 0008 12/01/16 0209 12/01/16 0817  HGB 10.2* 8.7*  --  8.0* 8.7*  HCT 33.5* 28.5*  --  26.4* 28.7*  PLT 217 171  --  149* 169  APTT  --   --  34  --   --   LABPROT  --   --  20.4* 17.7*  --   INR  --   --  1.73 1.44  --   HEPARINUNFRC  --   --  1.56*  --   --   CREATININE 1.21  --   --   --  1.05  TROPONINI  --  <0.03  --   --   --     Estimated Creatinine Clearance: 70.4 mL/min (by C-G formula based on SCr of 1.05 mg/dL).  Assessment: CC/HPI: 76 yo m presenting with AMS  PMH: CAD sp CABG, COPD, HTN, AFib on xarelto, recent admit for MRSA bacteremia   Anticoag: xarelto PTA for atrial fibrillation. No concern for hemothorax - initiated hep gtt 1800 7/25  Heme/Onc: given Eppie Gibson 7/25 am d/t drop in hgb possible hemothorax H&H 8/26.4, Plt 149  Pt is s/p thoracentesis that showed no bleeding  Goal of Therapy:  Heparin level 0.3-0.7 units/ml Monitor platelets by anticoagulation protocol: Yes   Plan:  -heparin 1350 units/hr @ 1800 -initial lvl at 0200 (aptt, hl) -daily HL, cbc, aptt -F/u conversion back to xarelto  Levester Fresh, PharmD, BCPS, BCCCP Clinical Pharmacist Clinical phone for 12/01/2016 from 7a-3:30p: [phone] If after 3:30p, please call main pharmacy at: [phone] 12/01/2016 12:48 PM

## 2016-12-02 ENCOUNTER — Inpatient Hospital Stay (HOSPITAL_COMMUNITY): Payer: Medicare PPO

## 2016-12-02 DIAGNOSIS — J869 Pyothorax without fistula: Secondary | ICD-10-CM

## 2016-12-02 LAB — CULTURE, RESPIRATORY

## 2016-12-02 LAB — BASIC METABOLIC PANEL
Anion gap: 8 (ref 5–15)
BUN: 19 mg/dL (ref 6–20)
CHLORIDE: 97 mmol/L — AB (ref 101–111)
CO2: 29 mmol/L (ref 22–32)
CREATININE: 1.01 mg/dL (ref 0.61–1.24)
Calcium: 7.6 mg/dL — ABNORMAL LOW (ref 8.9–10.3)
GFR calc non Af Amer: >60 mL/min (ref >60)
GLUCOSE: 92 mg/dL (ref 65–99)
Potassium: 4.5 mmol/L (ref 3.5–5.1)
Sodium: 134 mmol/L — ABNORMAL LOW (ref 135–145)

## 2016-12-02 LAB — CBC
HCT: 29.7 % — ABNORMAL LOW (ref 39.0–52.0)
HEMOGLOBIN: 9 g/dL — AB (ref 13.0–17.0)
MCH: 25.3 pg — AB (ref 26.0–34.0)
MCHC: 30.3 g/dL (ref 30.0–36.0)
MCV: 83.4 fL (ref 78.0–100.0)
PLATELETS: 183 10*3/uL (ref 150–400)
RBC: 3.56 MIL/uL — AB (ref 4.22–5.81)
RDW: 16.2 % — ABNORMAL HIGH (ref 11.5–15.5)
WBC: 7 10*3/uL (ref 4.0–10.5)

## 2016-12-02 LAB — GLUCOSE, CAPILLARY
GLUCOSE-CAPILLARY: 90 mg/dL (ref 65–99)
Glucose-Capillary: 96 mg/dL (ref 65–99)
Glucose-Capillary: 113 mg/dL — ABNORMAL HIGH (ref 65–99)
Glucose-Capillary: 91 mg/dL (ref 65–99)
Glucose-Capillary: 89 mg/dL (ref 65–99)
Glucose-Capillary: 105 mg/dL — ABNORMAL HIGH (ref 65–99)

## 2016-12-02 LAB — CULTURE, RESPIRATORY W GRAM STAIN

## 2016-12-02 LAB — PHOSPHORUS: Phosphorus: 3.7 mg/dL (ref 2.5–4.6)

## 2016-12-02 LAB — HEPARIN LEVEL (UNFRACTIONATED): HEPARIN UNFRACTIONATED: 0.9 [IU]/mL — AB (ref 0.30–0.70)

## 2016-12-02 LAB — MAGNESIUM: Magnesium: 1.7 mg/dL (ref 1.7–2.4)

## 2016-12-02 LAB — GLUCOSE, PLEURAL OR PERITONEAL FLUID

## 2016-12-02 LAB — APTT: aPTT: 78 seconds — ABNORMAL HIGH (ref 24–36)

## 2016-12-02 LAB — LACTIC ACID, PLASMA: Lactic Acid, Venous: 1.8 mmol/L (ref 0.5–1.9)

## 2016-12-02 MED ORDER — MIDAZOLAM HCL 2 MG/2ML IJ SOLN
INTRAMUSCULAR | Status: AC
  Administered 2016-12-02: 2 mg
  Filled 2016-12-02: qty 2

## 2016-12-02 MED ORDER — INSULIN ASPART 100 UNIT/ML ~~LOC~~ SOLN: 0.0000 [IU] | Freq: Three times a day (TID) | SUBCUTANEOUS | Status: DC

## 2016-12-02 MED ORDER — HEPARIN (PORCINE) IN NACL 100-0.45 UNIT/ML-% IJ SOLN
1700.0000 [IU]/h | INTRAMUSCULAR | Status: DC
  Administered 2016-12-02 (×2): 1350 [IU]/h via INTRAVENOUS
  Administered 2016-12-03: 1500 [IU]/h via INTRAVENOUS
  Administered 2016-12-04: 1950 [IU]/h via INTRAVENOUS
  Administered 2016-12-04: 1800 [IU]/h via INTRAVENOUS
  Administered 2016-12-05: 1950 [IU]/h via INTRAVENOUS
  Administered 2016-12-06: 1900 [IU]/h via INTRAVENOUS
  Filled 2016-12-02 (×9): qty 250

## 2016-12-02 MED ORDER — LIDOCAINE HCL (PF) 1 % IJ SOLN
INTRAMUSCULAR | Status: AC
  Administered 2016-12-02: 30 mL
  Filled 2016-12-02: qty 30

## 2016-12-02 MED ORDER — INSULIN ASPART 100 UNIT/ML ~~LOC~~ SOLN
0.0000 [IU] | SUBCUTANEOUS | Status: DC
  Administered 2016-12-03 – 2016-12-05 (×4): 2 [IU] via SUBCUTANEOUS
  Administered 2016-12-05: 3 [IU] via SUBCUTANEOUS
  Administered 2016-12-05 – 2016-12-06 (×6): 2 [IU] via SUBCUTANEOUS

## 2016-12-02 MED ORDER — METOPROLOL TARTRATE 5 MG/5ML IV SOLN
2.5000 mg | INTRAVENOUS | Status: AC | PRN
Start: 2016-12-02 — End: 2016-12-03
  Administered 2016-12-02: 2.5 mg via INTRAVENOUS
  Administered 2016-12-03 (×2): 5 mg via INTRAVENOUS
  Filled 2016-12-02 (×3): qty 5

## 2016-12-02 MED ORDER — MAGNESIUM SULFATE 2 GM/50ML IV SOLN
2.0000 g | Freq: Once | INTRAVENOUS | Status: AC
  Administered 2016-12-02: 2 g via INTRAVENOUS
  Filled 2016-12-02: qty 50

## 2016-12-02 MED ORDER — INSULIN ASPART 100 UNIT/ML ~~LOC~~ SOLN: 0.0000 [IU] | Freq: Every day | SUBCUTANEOUS | Status: DC

## 2016-12-02 MED ORDER — FENTANYL CITRATE (PF) 100 MCG/2ML IJ SOLN
INTRAMUSCULAR | Status: AC
  Administered 2016-12-02: 100 ug
  Filled 2016-12-02: qty 2

## 2016-12-02 MED ORDER — FENTANYL CITRATE (PF) 100 MCG/2ML IJ SOLN
25.0000 ug | INTRAMUSCULAR | Status: DC | PRN
Start: 2016-12-02 — End: 2016-12-03

## 2016-12-02 MED ORDER — METOPROLOL TARTRATE 12.5 MG HALF TABLET
12.5000 mg | ORAL_TABLET | Freq: Two times a day (BID) | ORAL | Status: DC
  Filled 2016-12-02 (×2): qty 1

## 2016-12-02 NOTE — Progress Notes (Signed)
Placed patient on NIV PC through [facility]. Patient desat due to laying flat for chest tube procedure.

## 2016-12-02 NOTE — Progress Notes (Signed)
PULMONARY / CRITICAL CARE MEDICINE   Name: Mike Becker MRN: [id number] DOB: 07/01/1940    ADMISSION DATE:  12/03/2016 CONSULTATION DATE:  11/21/2016  REFERRING MD:  Dr. Winfred Leeds  CHIEF COMPLAINT:  AMS  Brief:   76 yo male with PMH CAD s/p CABG 2004, COPD, HTN, Afib on xarelto and recent hospitalization 7/7-7/13 for MRSA bacteremia/endocarditis.   Presents 7/24 from SNF with AMS and multiple syncopal episodes. Upon arrival hypoxic in the low 80s with increased WOB, intubated in the ED. ABG 7.235/83.9/85.   SUBJECTIVE:  Extubated 7/25. No events overnight. This AM complaining of dyspnea   VITAL SIGNS: BP 115/66   Pulse 97   Temp 99.2 F (37.3 C) (Axillary)   Resp (!) 31   Ht 6\' 3"  (1.905 m)   Wt 92.5 kg (203 lb 14.8 oz)   SpO2 95%   BMI 25.49 kg/m   HEMODYNAMICS:    VENTILATOR SETTINGS: Vent Mode: CPAP FiO2 (%):  [40 %] 40 % Set Rate:  [20 bmp] 20 bmp Vt Set:  [550 mL] 550 mL PEEP:  [5 cmH20] 5 cmH20 Pressure Support:  [5 cmH20] 5 cmH20 Plateau Pressure:  [18 cmH20-24 cmH20] 24 cmH20  INTAKE / OUTPUT: I/O last 3 completed shifts: In: 1399 [I.V.:316.6; IV Piggyback:1082.4] Out: 2420 [Urine:1970; Emesis/NG output:450]  PHYSICAL EXAMINATION: General: Chronically ill appearing adult male, moderate respiratory distress   Neuro:  Alert, awake, follows commands, moves extremities  HEENT:  PERRL. ETT in place Cardiovascular:  Tachy, no murmurs Lungs:  Diminished breath sounds at right bases, no wheeze/crackles  Abdomen:  Distended, nontender, + bowel sounds Musculoskeletal:  3+ pitting edema b/l in LE, 1+ pitting edema in UE b/l. Skin:  Chronic venous changes b/l on ankles, swelling to left arm   LABS:  BMET  Recent Labs Lab 11/24/2016 1710 12/01/16 0817 12/02/16 0223  NA 130* 135 134*  K 4.7 4.4 4.5  CL 93* 99* 97*  CO2 31 30 29   BUN 22* 18 19  CREATININE 1.21 1.05 1.01  GLUCOSE 182* 80 92    Electrolytes  Recent Labs Lab 11/16/2016 1710  12/01/16 0817 12/02/16 0223  CALCIUM 7.8* 7.6* 7.6*  MG  --   --  1.7  PHOS  --   --  3.7    CBC  Recent Labs Lab 12/01/16 0209 12/01/16 0817 12/02/16 0223  WBC 7.3 7.7 7.0  HGB 8.0* 8.7* 9.0*  HCT 26.4* 28.7* 29.7*  PLT 149* 169 183    Coag's  Recent Labs Lab 12/01/16 0008 12/01/16 0209 12/02/16 0223  APTT 34  --  78*  INR 1.73 1.44  --     Sepsis Markers  Recent Labs Lab 11/27/2016 1721 12/01/16 1500 12/02/16 0223  LATICACIDVEN 1.92* 2.2* 1.8    ABG  Recent Labs Lab 11/25/2016 1729 12/02/2016 1828 12/01/16 0002  PHART 7.235* 7.345* 7.401  PCO2ART 83.9* 63.4* 51.1*  PO2ART 85.0 288.0* 117.0*    Liver Enzymes  Recent Labs Lab 11/12/2016 1710  AST 29  ALT 23  ALKPHOS 172*  BILITOT 0.9  ALBUMIN 1.8*    Cardiac Enzymes  Recent Labs Lab 11/16/2016 2210  TROPONINI <0.03    Glucose  Recent Labs Lab 12/01/16 0741 12/01/16 1204 12/01/16 1540 12/01/16 2024 12/01/16 2348 12/02/16 0331  GLUCAP 77 83 88 85 89 96    Imaging Dg Chest Port 1 View  Result Date: 12/02/2016 CLINICAL DATA:  Respiratory failure.  Shortness of breath. EXAM: PORTABLE CHEST 1 VIEW COMPARISON:  12/01/2016 .  11/13/2016 .  CT 11/14/2016 . FINDINGS: Interim extubation and removal of NG tube. Right PICC line stable position. Prior CABG. Stable cardiomegaly. No pulmonary venous congestion. Progressive right pleural effusion with near complete opacification right hemithorax. Volume loss right lung noted. Persistent bilateral interstitial prominence with increased in interstitial markings in the left lung base. Changes could be related to pneumonitis, interstitial edema, and/or interstitial tumor spread . No pneumothorax. IMPRESSION: 1. Interim extubation removal of NG tube. Right PICC line stable position. 2. Progression of right pleural effusion with near opacification right hemithorax. Volume loss right lung noted. 3. Persistent bilateral interstitial prominence with increase in  interstitial markings in the left lung base. Changes could be related to pneumonitis, interstitial edema, and/or interstitial tumor spread. Reference is made to prior CT report 11/14/2016. Electronically Signed   By: Marcello Moores  Register   On: 12/02/2016 06:49   Dg Chest Port 1 View  Result Date: 12/01/2016 CLINICAL DATA:  Post thoracentesis EXAM: PORTABLE CHEST 1 VIEW COMPARISON:  Portable exam 1222 hours compared to is 11/19/2016 FINDINGS: Endotracheal tube stable tip projecting 7.8 cm. Nasogastric tube extends into stomach. Tip of RIGHT arm PICC line projects over SVC. Atherosclerotic calcification aorta. Post median sternotomy. Emphysematous changes with persistent LEFT basilar atelectasis. Decrease in RIGHT pleural effusion post thoracentesis without pneumothorax. Persistent asymmetric infiltrate RIGHT lung. Bones demineralized with note of old LEFT rib fractures. IMPRESSION: Decrease in RIGHT pleural effusion post thoracentesis. No pneumothorax. COPD changes with persistent asymmetric RIGHT lung infiltrates and LEFT basilar atelectasis. Aortic Atherosclerosis (ICD10-I70.0) and Emphysema (ICD10-J43.9). Electronically Signed   By: Lavonia Dana M.D.   On: 12/01/2016 12:56     STUDIES:  CT chest 7/8> Large complex diffusely heterogeneous mass at R hilium 8.8 x 8.1 x 6.5 cm, with multiple areas of central necrosis c/w primary bronchogenic malignancy. Enlarged mediastinal nodes. Scattered small left-sided pulmonary nodules. TEE 7/11 > 55-60%, wall motion normal, small highly mobile vegetations on aortic and mitral valves CXR 7/24 > Large right pleural effusion with near complete passive atelectasis in the right lung. Areas of atelectasis and/or airspace consolidation in the left lower lobe. ECHO 7/25 > EF 60-65, Moderate Concentric Hypertrophy, systolic function normal, normal wall motion.   CULTURES: Blood 7/24 >> Sputum 7/24 >>    ANTIBIOTICS: Vanc 7/8 > with stop date of 8/18  Zosyn 7/24 >    SIGNIFICANT EVENTS: 7/24 > admitted, intubated  7/25 > Thoracentesis with 600 ml dark red output > LD 1,753, Protein <3.0, WBC 11,310, Lymphs 1, Neutro 95, Monocyte 4   LINES/TUBES: R UE PICC 7/13 > ETT 7/24 > 7/25 NGT 7/24 > 7/25  PIV  DISCUSSION: 76yo male with recently hospitalization for MRSA bacteremia and endocarditis who presented with AMS 2/2 hypercarbia, acute respiratory failure from a large R pleural effusion in the setting of known R lung mass.  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 > last echo with EF 60% and vegetations on AV, MV, TV  H/O Thoracic aortic aneurysm, AFib (on Xarelto), CAD s/p CABG 2004   P:  Cardiac Monitoring  Continue home amiodarone Start home lopressor at 12.5 mg BID  Started Heparin gtt 7/25   RENAL A:   Mild AKI Hyponatremia ?SIADH given tumor burden > Improving  P:   Trend BMP  Replace electrolytes as needed   GASTROINTESTINAL A:   Dysphagia  ?Cirrhosis  -noted on CT chest, Viral Hep panel negative GI ppx P:   RUQ ultrasound with doppler pending  Speech therapy evaluation ordered  PPI    HEMATOLOGIC A:   Acute anemia DVT ppx P:  Trend CBC Heparin gtt as above  SCDs  INFECTIOUS A:   MRSA bacteremia and endocarditis Cellulitis ?  -WBC 7.7, Tmax 100.7 P:    Trend Fever and WBC curve  Follow Culture Data  Continue Vancomycin and Zosyn   ENDOCRINE A:   Hyperglycemia   P:   Trend Glucose  SSI  NEUROLOGIC A:   Altered mental status > improved P:   Monitor  PRN fentanyl    FAMILY  - Updates: Goals of care discussion 7/25, patient is a no CPR and no intubation   - Inter-disciplinary family meet or Palliative Care meeting due by: 12/07/16  CC Time: 34 minutes   Hayden Pedro, AGACNP-BC [facility]  Pgr: [phone]  PCCM Pgr: [phone]

## 2016-12-02 NOTE — Progress Notes (Signed)
Pt getting increasingly hungry throughout the day and crying for food. This nurse talked with speech therapy this morning, when pt on bipap, who said they would come back late afternoon to eval but in the mean time we could try applesauce or something more thick. Gave pt pudding and on the third bite he started coughing uncontrollably. Family at bedside when this happened. Explained to pt that we need to wait for SLP to evaluate before he can receive more food.

## 2016-12-02 NOTE — Care Management Note (Signed)
Case Management Note  Patient Details  Name: Mike Becker MRN: [id number] Date of Birth: 12-Aug-1940  Subjective/Objective:  From SNF, [facility], presents with acute PNA with empyema, lung mass with adenopathy, MRSA endocardits, afib. conts on HFNC .                Action/Plan: NCM will follow along with CSW for dc needs.   Expected Discharge Date:                  Expected Discharge Plan:  Skilled Nursing Facility  In-House Referral:  Clinical Social Work  Discharge planning Services  CM Consult  Post Acute Care Choice:    Choice offered to:     DME Arranged:    DME Agency:     HH Arranged:    [place] Agency:     Status of Service:  In process, will continue to follow  If discussed at Long Length of Stay Meetings, dates discussed:    Additional Comments:  Zenon Mayo, RN 12/02/2016, 5:13 PM

## 2016-12-02 NOTE — Progress Notes (Signed)
ANTICOAGULATION CONSULT NOTE - Follow Up Consult  Pharmacy Consult for heparin Indication: atrial fibrillation  Labs:  Recent Labs  11/29/2016 1710 11/14/2016 2210 12/01/16 0008 12/01/16 0209 12/01/16 0817 12/02/16 0223  HGB 10.2* 8.7*  --  8.0* 8.7* 9.0*  HCT 33.5* 28.5*  --  26.4* 28.7* 29.7*  PLT 217 171  --  149* 169 183  APTT  --   --  34  --   --  78*  LABPROT  --   --  20.4* 17.7*  --   --   INR  --   --  1.73 1.44  --   --   HEPARINUNFRC  --   --  1.56*  --   --  0.90*  CREATININE 1.21  --   --   --  1.05  --   TROPONINI  --  <0.03  --   --   --   --     Assessment/Plan:  76yo male therapeutic on heparin with initial dosing while Xarelto on hold. Will continue gtt at current rate and confirm stable with additional PTT.   Wynona Neat, PharmD, BCPS  12/02/2016,3:17 AM

## 2016-12-02 NOTE — Progress Notes (Signed)
SLP Cancellation Note  Patient Details Name: Mike Becker MRN: [id number] DOB: December 06, 1940   Cancelled treatment:       Reason Eval/Treat Not Completed: Medical issues which prohibited therapy. Pt on BiPAP, sedated. Will try to f/u in pm for swallow eval.    Sundy Houchins, Katherene Ponto 12/02/2016, 9:43 AM

## 2016-12-02 NOTE — Procedures (Signed)
KALEI MCKILLOP 1941-03-22 [id number]  Procedure: Right chest tube insertion Indications: Right empyema  Description: Procedure done in the ICU.  He was given 2 mg versed, 100 mcg fentanyl.  He had oxygen desaturation after conscious sedation and required bipap to maintain oxygenation.  Prepped and draped right 6th rib space area in midaxillary line.  Total of 20 ml of 1% lidocaine instilled for topical anesthesia.  Skin incision made, and then blunt dissection to intercostal muscle.  Using hemostat, punctured intercostal muscle to enter pleural space.  There was a rush of red fluid from pleural space.  Inserted 32 french chest tube and connected to -20 suction.  Approximately 1600 ml of fluid immediately removed.  Chest tube sutured in place and dressing applied.  No other immediate complications, and patient transitioned off Bipap after sedation resolved.  Post procedure chest xray shows chest tube in good position.  Chesley Mires, MD [facility]/Critical [facility] 12/02/2016, 11:45 AM Pager:  [phone] After 3pm call: [phone]

## 2016-12-02 NOTE — Progress Notes (Signed)
PT Cancellation Note  Patient Details Name: Mike Becker MRN: [id number] DOB: 1940/07/06   Cancelled Treatment:    Reason Eval/Treat Not Completed: Medical issues which prohibited therapy; noted pt placed on bipap and s/p chest tube insertion.  Will attempt when stable.    Reginia Naas 12/02/2016, 5:28 PM  Magda Kiel, [place] 12/02/2016

## 2016-12-03 ENCOUNTER — Inpatient Hospital Stay (HOSPITAL_COMMUNITY): Payer: Medicare PPO

## 2016-12-03 LAB — PHOSPHORUS: PHOSPHORUS: 3.6 mg/dL (ref 2.5–4.6)

## 2016-12-03 LAB — BASIC METABOLIC PANEL
Anion gap: 5 (ref 5–15)
BUN: 18 mg/dL (ref 6–20)
CHLORIDE: 99 mmol/L — AB (ref 101–111)
CO2: 31 mmol/L (ref 22–32)
Calcium: 8.2 mg/dL — ABNORMAL LOW (ref 8.9–10.3)
Creatinine, Ser: 1 mg/dL (ref 0.61–1.24)
GFR calc non Af Amer: >60 mL/min (ref >60)
Glucose, Bld: 94 mg/dL (ref 65–99)
POTASSIUM: 4.4 mmol/L (ref 3.5–5.1)
SODIUM: 135 mmol/L (ref 135–145)

## 2016-12-03 LAB — CBC
HEMATOCRIT: 34.2 % — AB (ref 39.0–52.0)
Hemoglobin: 10.5 g/dL — ABNORMAL LOW (ref 13.0–17.0)
MCH: 25.5 pg — ABNORMAL LOW (ref 26.0–34.0)
MCHC: 30.7 g/dL (ref 30.0–36.0)
MCV: 83.2 fL (ref 78.0–100.0)
Platelets: 208 10*3/uL (ref 150–400)
RBC: 4.11 MIL/uL — ABNORMAL LOW (ref 4.22–5.81)
RDW: 16.2 % — AB (ref 11.5–15.5)
WBC: 7.4 10*3/uL (ref 4.0–10.5)

## 2016-12-03 LAB — GLUCOSE, CAPILLARY
GLUCOSE-CAPILLARY: 144 mg/dL — AB (ref 65–99)
GLUCOSE-CAPILLARY: 83 mg/dL (ref 65–99)
GLUCOSE-CAPILLARY: 91 mg/dL (ref 65–99)
GLUCOSE-CAPILLARY: 94 mg/dL (ref 65–99)
Glucose-Capillary: 90 mg/dL (ref 65–99)
Glucose-Capillary: 93 mg/dL (ref 65–99)

## 2016-12-03 LAB — TROPONIN I: Troponin I: 0.03 ng/mL (ref <0.03)

## 2016-12-03 LAB — HEPARIN LEVEL (UNFRACTIONATED)
Heparin Unfractionated: 0.27 IU/mL — ABNORMAL LOW (ref 0.30–0.70)
Heparin Unfractionated: 0.2 IU/mL — ABNORMAL LOW (ref 0.30–0.70)

## 2016-12-03 LAB — MAGNESIUM: MAGNESIUM: 2 mg/dL (ref 1.7–2.4)

## 2016-12-03 LAB — APTT: aPTT: 59 seconds — ABNORMAL HIGH (ref 24–36)

## 2016-12-03 LAB — VANCOMYCIN, TROUGH: VANCOMYCIN TR: 24 ug/mL — AB (ref 15–20)

## 2016-12-03 MED ORDER — HEPARIN BOLUS VIA INFUSION
1000.0000 [IU] | Freq: Once | INTRAVENOUS | Status: AC
  Administered 2016-12-03: 1000 [IU] via INTRAVENOUS
  Filled 2016-12-03: qty 1000

## 2016-12-03 MED ORDER — VANCOMYCIN HCL 500 MG IV SOLR
500.0000 mg | Freq: Two times a day (BID) | INTRAVENOUS | Status: DC
  Administered 2016-12-03 – 2016-12-06 (×7): 500 mg via INTRAVENOUS
  Filled 2016-12-03 (×8): qty 500

## 2016-12-03 MED ORDER — RESOURCE THICKENUP CLEAR PO POWD
ORAL | Status: DC | PRN
  Filled 2016-12-03: qty 125

## 2016-12-03 MED ORDER — METOPROLOL TARTRATE 5 MG/5ML IV SOLN
2.5000 mg | Freq: Four times a day (QID) | INTRAVENOUS | Status: DC
  Administered 2016-12-03 – 2016-12-05 (×6): 2.5 mg via INTRAVENOUS
  Filled 2016-12-03 (×9): qty 5

## 2016-12-03 MED ORDER — FENTANYL CITRATE (PF) 100 MCG/2ML IJ SOLN
25.0000 ug | INTRAMUSCULAR | Status: DC | PRN
  Administered 2016-12-03: 25 ug via INTRAVENOUS
  Administered 2016-12-06: 50 ug via INTRAVENOUS
  Administered 2016-12-06: 25 ug via INTRAVENOUS
  Administered 2016-12-06: 50 ug via INTRAVENOUS
  Filled 2016-12-03 (×4): qty 2

## 2016-12-03 NOTE — Care Management Important Message (Signed)
Important Message  Patient Details  Name: KUZEY OGATA MRN: [id number] Date of Birth: Jun 23, 1940   Medicare Important Message Given:  Yes    Armie Moren, Rory Percy, RN 12/03/2016, 3:07 PM

## 2016-12-03 NOTE — Progress Notes (Addendum)
ANTICOAGULATION & ANTIBIOTIC CONSULT NOTE - Follow Up Consult  Pharmacy Consult for heparin (holding xarelto) & Vancomycin/Zosyn Indication: atrial fibrillation/sepsis   No Known Allergies  Patient Measurements: Height: 6\' 3"  (190.5 cm) Weight: 198 lb 3.1 oz (89.9 kg) IBW/kg (Calculated) : 84.5   Vital Signs: Temp: 98 F (36.7 C) (07/27 0358) Temp Source: Oral (07/27 0358) BP: 126/78 (07/27 0700) Pulse Rate: 92 (07/27 0700)  Labs:  Recent Labs  11/10/2016 1710 11/14/2016 2210 12/01/16 0008 12/01/16 0209 12/01/16 0817 12/02/16 0223 12/03/16 0701  HGB 10.2* 8.7*  --  8.0* 8.7* 9.0* 10.5*  HCT 33.5* 28.5*  --  26.4* 28.7* 29.7* 34.2*  PLT 217 171  --  149* 169 183 208  APTT  --   --  34  --   --  78* 59*  LABPROT  --   --  20.4* 17.7*  --   --   --   INR  --   --  1.73 1.44  --   --   --   HEPARINUNFRC  --   --  1.56*  --   --  0.90* 0.27*  CREATININE 1.21  --   --   --  1.05 1.01  --   TROPONINI  --  <0.03  --   --   --   --   --     Estimated Creatinine Clearance: 73.2 mL/min (by C-G formula based on SCr of 1.01 mg/dL).  Assessment: CC/HPI: 76 yo m presenting with AMS  Anticoag: xarelto PTA for atrial fibrillation. Currently on IV heparin at 1350 units/hr. HL this AM was sub-therapeutic and correlating with aPTT.   Heme/Onc: given Eppie Gibson 7/25 am d/t drop in hgb possible hemothorax H&H 8/26.4, Plt 149  ID: Currently IV vancomycin for h/o MRSA and D#3 of IV zosyn for empiric coverage. Afebrile, WBC wnl. A vanc trough drawn 2 hours early was supratherapeutic today.    Goal of Therapy:  Heparin level 0.3-0.7 units/ml Monitor platelets by anticoagulation protocol: Yes   Plan:  -Heparin 1000 units IV bolus x 1 -Increase heparin to 1500 units/hr. F/u HL in 8 hours  -daily HL, cbc -F/u conversion back to xarelto -Decrease vancomycin to 500 mg IV Q 12 hours -Continue Zosyn 3.375 gm IV Q 8 hours -F/u VT at steady state   Albertina Parr, PharmD.,  BCPS Clinical Pharmacist Pager [phone]

## 2016-12-03 NOTE — Progress Notes (Signed)
PULMONARY / CRITICAL CARE MEDICINE   Name: Mike Becker MRN: [id number] DOB: 1940-11-17    ADMISSION DATE:  11/18/2016 CONSULTATION DATE:  11/20/2016  REFERRING MD:  Dr. Winfred Leeds  CHIEF COMPLAINT:  AMS  Brief:   76 yo male with PMH CAD s/p CABG 2004, COPD, HTN, Afib on xarelto and recent hospitalization 7/7-7/13 for MRSA bacteremia/endocarditis. Also had large right empyema which was drained in the setting of a R lung mass.  Presents 7/24 from SNF with AMS and multiple syncopal episodes. Upon arrival hypoxic in the low 80s with increased WOB, intubated in the ED.   SUBJECTIVE:  No acute events overnight Afebrile Feels hungry this morning, denies pain.  VITAL SIGNS: BP 126/78   Pulse 92   Temp 98 F (36.7 C) (Oral)   Resp (!) 24   Ht 6\' 3"  (1.905 m)   Wt 89.9 kg (198 lb 3.1 oz)   SpO2 97%   BMI 24.77 kg/m   HEMODYNAMICS:    VENTILATOR SETTINGS: On HFNC 6L  INTAKE / OUTPUT: I/O last 3 completed shifts: In: 1118.2 [I.V.:418.2; IV MOQHUTMLY:650] Out: 2950 [Urine:1200; Chest Tube:1750]  PHYSICAL EXAMINATION: General: Chronically ill appearing adult male, in NAD Neuro:  Alert, awake, follows commands, moves extremities  HEENT:  [place], AT. PERRL. [place] in place Cardiovascular:  RRR, no murmurs Lungs:  Diminished breath sounds at right bases, rhonchi on expiration, no wheeze/crackles  Abdomen:  Nontender, nondistended, + bowel sounds Musculoskeletal:  3+ pitting edema b/l in LE, 1+ pitting edema in UE b/l. Skin:  Chronic venous changes b/l on ankles, swelling to left arm with erythema within marked borders  LABS:  BMET  Recent Labs Lab 12/05/2016 1710 12/01/16 0817 12/02/16 0223  NA 130* 135 134*  K 4.7 4.4 4.5  CL 93* 99* 97*  CO2 31 30 29   BUN 22* 18 19  CREATININE 1.21 1.05 1.01  GLUCOSE 182* 80 92    Electrolytes  Recent Labs Lab 12/03/2016 1710 12/01/16 0817 12/02/16 0223  CALCIUM 7.8* 7.6* 7.6*  MG  --   --  1.7  PHOS  --   --  3.7     CBC  Recent Labs Lab 12/01/16 0817 12/02/16 0223 12/03/16 0701  WBC 7.7 7.0 7.4  HGB 8.7* 9.0* 10.5*  HCT 28.7* 29.7* 34.2*  PLT 169 183 208    Coag's  Recent Labs Lab 12/01/16 0008 12/01/16 0209 12/02/16 0223 12/03/16 0701  APTT 34  --  78* 59*  INR 1.73 1.44  --   --     Sepsis Markers  Recent Labs Lab 11/09/2016 1721 12/01/16 1500 12/02/16 0223  LATICACIDVEN 1.92* 2.2* 1.8    ABG  Recent Labs Lab 11/11/2016 1729 11/25/2016 1828 12/01/16 0002  PHART 7.235* 7.345* 7.401  PCO2ART 83.9* 63.4* 51.1*  PO2ART 85.0 288.0* 117.0*    Liver Enzymes  Recent Labs Lab 11/22/2016 1710  AST 29  ALT 23  ALKPHOS 172*  BILITOT 0.9  ALBUMIN 1.8*    Cardiac Enzymes  Recent Labs Lab 11/19/2016 2210  TROPONINI <0.03    Glucose  Recent Labs Lab 12/02/16 1011 12/02/16 1202 12/02/16 1541 12/02/16 2029 12/02/16 2330 12/03/16 0404  GLUCAP 113* 91 90 89 105* 91    Imaging Dg Chest Port 1 View  Result Date: 12/03/2016 CLINICAL DATA:  Empyema. EXAM: PORTABLE CHEST 1 VIEW COMPARISON:  12/02/2016. FINDINGS: Right PICC line and right chest tube in stable position. Prior CABG. Heart size stable. Persistent diffuse right lung interstitial prominence. Progressive  left lung interstitial prominence. CHF cannot be excluded. Progressive right-sided pleural effusion with volume loss right lung. IMPRESSION: 1. Right PICC line and right chest tube in stable position. Progressive right-sided pleural effusion. Large right pleural effusion noted on today's exam. Volume loss right lung. 2. Prior CABG. Heart size stable. Persistent diffuse right lung interstitial prominence. Progressive left lung interstitial prominence. CHF cannot be excluded. Electronically Signed   By: Marcello Moores  Register   On: 12/03/2016 06:52   Dg Chest Port 1 View  Result Date: 12/02/2016 CLINICAL DATA:  RIGHT chest tube insertion EXAM: PORTABLE CHEST 1 VIEW COMPARISON:  Portable exam 0944 hours compared to  0435 hours FINDINGS: New RIGHT thoracostomy tube with decrease in RIGHT pleural effusion. RIGHT arm PICC line tip projects over SVC. Normal heart size post median sternotomy. Atherosclerotic calcifications aorta. Emphysematous changes with significant diffuse interstitial infiltrate in RIGHT lung and scattered mild interstitial infiltrates in the lower LEFT lung. No pneumothorax. Bones demineralized. IMPRESSION: Decreased atelectasis and decreased RIGHT pleural effusion post RIGHT thoracostomy tube. Persistent asymmetric infiltrates RIGHT greater than LEFT. Aortic Atherosclerosis (ICD10-I70.0) and Emphysema (ICD10-J43.9). Electronically Signed   By: Lavonia Dana M.D.   On: 12/02/2016 10:00     STUDIES:  CT chest 7/8> Large complex diffusely heterogeneous mass at R hilium 8.8 x 8.1 x 6.5 cm, with multiple areas of central necrosis c/w primary bronchogenic malignancy. Enlarged mediastinal nodes. Scattered small left-sided pulmonary nodules. TEE 7/11 > 55-60%, wall motion normal, small highly mobile vegetations on aortic and mitral valves CXR 7/24 > Large right pleural effusion with near complete passive atelectasis in the right lung. Areas of atelectasis and/or airspace consolidation in the left lower lobe. RUQ [place] 7/24 > Increased hepatic echogenicity suggesting fatty infiltration or hepatocellular disease. No focal abnormality. TTE 7/25 > EF 60-65%, Moderate Concentric Hypertrophy, systolic function normal, normal wall motion.     CULTURES: Blood Cx 7/24 > NG x 2d Sputum 7/24 >>  Few candida albicans Pleural fluid Cx 7/25 > NG x 1d   ANTIBIOTICS: Vanc 7/8 > with stop date of 8/18  Zosyn 7/24 >   SIGNIFICANT EVENTS: 7/24 > admitted, intubated  7/25 > Thoracentesis, extubated 7/26 > concern for aspiration  LINES/TUBES: R UE PICC 7/13 > ETT 7/24 > 7/25 NGT 7/24 > 7/25  R pleural chest tube 7/26 >  PIV  DISCUSSION: 76yo male with recently hospitalization for MRSA bacteremia and  endocarditis who presented with AMS 2/2 hypercarbia, acute respiratory failure from a large R pleural effusion in the setting of known R lung mass.  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 Cellulitis  Aspiration P:   Trend Fever and WBC curve  Follow Culture Data  Continue Vancomycin and Zosyn   ENDOCRINE A:   Hyperglycemia   P:   Trend Glucose  SSI  NEUROLOGIC A:   Altered mental status > improved P:   Monitor  PRN fentanyl    FAMILY  - Updates: Goals of care discussion 7/25, patient is a no CPR and no intubation. Son updated at bedside 7/27  - Inter-disciplinary family meet or Palliative Care meeting due by: 12/07/16  Bufford Lope, DO PGY-2, [facility] 12/03/2016 7:51 AM

## 2016-12-03 NOTE — Progress Notes (Signed)
Rt placed pt on BIPAP/NIV due to low sats.

## 2016-12-03 NOTE — Progress Notes (Signed)
ANTICOAGULATION CONSULT NOTE - Follow Up Consult  Pharmacy Consult for heparin (holding xarelto) Indication: atrial fibrillation/sepsis   No Known Allergies  Patient Measurements: Height: 6\' 3"  (190.5 cm) Weight: 198 lb 3.1 oz (89.9 kg) IBW/kg (Calculated) : 84.5   Vital Signs: Temp: 98.3 F (36.8 C) (07/27 1530) Temp Source: Oral (07/27 1530) BP: 99/73 (07/27 1700) Pulse Rate: 113 (07/27 1700)  Labs:  Recent Labs  11/14/2016 2210  12/01/16 0008 12/01/16 0209 12/01/16 0817 12/02/16 0223 12/03/16 0701 12/03/16 1152 12/03/16 1555  HGB 8.7*  --   --  8.0* 8.7* 9.0* 10.5*  --   --   HCT 28.5*  --   --  26.4* 28.7* 29.7* 34.2*  --   --   PLT 171  --   --  149* 169 183 208  --   --   APTT  --   --  34  --   --  78* 59*  --   --   LABPROT  --   --  20.4* 17.7*  --   --   --   --   --   INR  --   --  1.73 1.44  --   --   --   --   --   HEPARINUNFRC  --   < > 1.56*  --   --  0.90* 0.27*  --  0.20*  CREATININE  --   --   --   --  1.05 1.01 1.00  --   --   TROPONINI <0.03  --   --   --   --   --   --  0.03*  --   < > = values in this interval not displayed.  Estimated Creatinine Clearance: 73.9 mL/min (by C-G formula based on SCr of 1 mg/dL).  Assessment: 31 yoM with AMS on xarelto PTA for afib. S/p kcentra 7/25 am 2/2 hgb drop and ?hemothorax. Hgb and pltc up. No issues with infusion or bleeding per nurse. Rebolus and increase rate.   Goal of Therapy:  Heparin level 0.3-0.7 units/ml Monitor platelets by anticoagulation protocol: Yes   Plan:  -Heparin 1000 units IV bolus x 1 -Increase heparin to 1700 units/hr. -F/u HL in 8 hours  -daily HL, cbc -F/u conversion back to [place], PharmD Clinical Pharmacist [phone] (Pager) 12/03/2016 5:22 PM

## 2016-12-03 NOTE — Progress Notes (Addendum)
Pt OOB with physical therapy and about 30 minutes later asked nurses to bet back in bed. This nurse and Zandra Abts helped him back to bed when he became SOB, tachypnic, O2 sats 70s, and HR in 140s. This nurse asked pt if he wanted to be on bipap mask and he agreed "for some time but not too long." RT placed pt on bipap, he was on it for 15 minutes while breathing got less labored and he requested it back off. Pt now on 10L HFNC, A & O, and son back at bedside updated on the occurrence. Louretta Parma NP made aware and gave pt PRN Metoprolol per her verbal order.

## 2016-12-03 NOTE — Evaluation (Signed)
Physical Therapy Evaluation Patient Details Name: Mike Becker MRN: [id number] DOB: [birth year] Today's Date: 12/03/2016   History of Present Illness  76yo male with recently hospitalization for MRSA bacteremia and endocarditis who presented 7/24 with AMS 2/2 hypercarbia, acute respiratory failure from a large R pleural effusion in the setting of known R lung mass. Intubated 7/24-7/25 and underwent thoracenesis 7/25.  S/p R chest tube 7/26.  Clinical Impression  Patient presents with decreased mobility due to deficits listed in PT problem list.  He was ambulating with walker 100' at last admission, but currently limited to OOB to chair only with assist due to weakness, limited activity tolerance and may benefit from skilled PT in the acute setting prior to d/c to SNF level rehab.  Meeting today with palliative so will follow up as per their recommendations as well.     Follow Up Recommendations SNF    Equipment Recommendations  Rolling walker with 5" wheels    Recommendations for Other Services       Precautions / Restrictions Precautions Precautions: Fall Restrictions Weight Bearing Restrictions: No      Mobility  Bed Mobility Overal bed mobility: Needs Assistance Bed Mobility: Supine to Sit     Supine to sit: HOB elevated;Mod assist     General bed mobility comments: assist for lifting trunk, pt able to bring legs off EOB  Transfers Overall transfer level: Needs assistance Equipment used: None Transfers: Squat Pivot Transfers     Squat pivot transfers: Mod assist     General transfer comment: pt scooted to EOB and assist for safety with squat pivot to recliner and to scoot back in chair  Ambulation/Gait                Stairs            Wheelchair Mobility    Modified Rankin (Stroke Patients Only)       Balance Overall balance assessment: Needs assistance Sitting-balance support: Feet supported Sitting balance-Leahy Scale:  Fair Sitting balance - Comments: sat EOB about 8 minutes                                     Pertinent Vitals/Pain Faces Pain Scale: Hurts little more Pain Location: R chest Pain Descriptors / Indicators: Guarding;Sore Pain Intervention(s): Monitored during session;Repositioned    Home Living Family/patient expects to be discharged to:: Skilled nursing facility ([facility]) Living Arrangements: Alone Available Help at Discharge: Family;Available PRN/intermittently Type of Home: Apartment Home Access: Level entry     Home Layout: One level Home Equipment: Grab bars - tub/shower;Grab bars - toilet      Prior Function Level of Independence: Needs assistance   Gait / Transfers Assistance Needed: reports limited mobility since recent d/c was independent prior to most recent admission           Hand Dominance        Extremity/Trunk Assessment   Upper Extremity Assessment Upper Extremity Assessment: Generalized weakness    Lower Extremity Assessment Lower Extremity Assessment: Generalized weakness    Cervical / Trunk Assessment Cervical / Trunk Assessment: Kyphotic  Communication   Communication: No difficulties  Cognition Arousal/Alertness: Awake/alert Behavior During Therapy: WFL for tasks assessed/performed Overall Cognitive Status: Within Functional Limits for tasks assessed  General Comments General comments (skin integrity, edema, etc.): initially HR 140's sitting EOB, then down to 130's prior to OOB; RN aware; on 8L hi flow O2    Exercises     Assessment/Plan    PT Assessment Patient needs continued PT services  PT Problem List Decreased strength;Decreased activity tolerance;Decreased balance;Decreased mobility;Decreased knowledge of use of DME;Cardiopulmonary status limiting activity;Impaired sensation       PT Treatment Interventions DME instruction;Gait  training;Patient/family education;Therapeutic exercise;Therapeutic activities;Balance training;Functional mobility training;Wheelchair mobility training    PT Goals (Current goals can be found in the Care Plan section)  Acute Rehab PT Goals Patient Stated Goal: To meet with family PT Goal Formulation: With patient Time For Goal Achievement: 12/17/16 Potential to Achieve Goals: Fair    Frequency Min 2X/week   Barriers to discharge        Co-evaluation               AM-PAC PT "6 Clicks" Daily Activity  Outcome Measure Difficulty turning over in bed (including adjusting bedclothes, sheets and blankets)?: A Lot Difficulty moving from lying on back to sitting on the side of the bed? : Total Difficulty sitting down on and standing up from a chair with arms (e.g., wheelchair, bedside commode, etc,.)?: Total Help needed moving to and from a bed to chair (including a wheelchair)?: A Lot Help needed walking in hospital room?: Total Help needed climbing 3-5 steps with a railing? : Total 6 Click Score: 8    End of Session Equipment Utilized During Treatment: Oxygen;Gait belt Activity Tolerance: Patient limited by fatigue Patient left: in chair;with call bell/phone within reach;with family/visitor present Nurse Communication: Other (comment);Mobility status (trying to use urinal in chair) PT Visit Diagnosis: Muscle weakness (generalized) (M62.81);Other abnormalities of gait and mobility (R26.89)    Time: 9242-6834 PT Time Calculation (min) (ACUTE ONLY): 26 min   Charges:   PT Evaluation $PT Eval High Complexity: 1 Procedure PT Treatments $Therapeutic Activity: 8-22 mins   PT G CodesMagda Kiel, [place] [phone] 12/03/2016   Reginia Naas 12/03/2016, 3:51 PM

## 2016-12-03 NOTE — Progress Notes (Signed)
Pt taken off BIPAP and placed back on 10 LPMHF. Pt request for BIPAP to be removed. No distress noted.

## 2016-12-03 NOTE — Evaluation (Signed)
Clinical/Bedside Swallow Evaluation Patient Details  Name: Mike Becker MRN: [id number] Date of Birth: [birth year]  Today's Date: 12/03/2016 Time: SLP Start Time (ACUTE ONLY): 0910 SLP Stop Time (ACUTE ONLY): 1015 SLP Time Calculation (min) (ACUTE ONLY): 65 min  Past Medical History:  Past Medical History:  Diagnosis Date  . A-fib ([place])   . COPD (chronic obstructive pulmonary disease) ([place])   . Dyspnea   . Dysrhythmia   . Hypertension   . MI (myocardial infarction) [facility])    Past Surgical History:  Past Surgical History:  Procedure Laterality Date  . EXPLORATION POST OPERATIVE OPEN HEART    . TEE WITHOUT CARDIOVERSION N/A 11/17/2016   Procedure: TRANSESOPHAGEAL ECHOCARDIOGRAM (TEE);  Surgeon: Dixie Dials, MD;  Location: [facility] ENDOSCOPY;  Service: Cardiovascular;  Laterality: N/A;  . triple bypass     HPI:  76yo male with recently hospitalization for MRSA bacteremia and endocarditis who presented with AMS 2/2 hypercarbia, acute respiratory failure from a large R pleural effusion in the setting of known R lung mass. Pt underwent MBS on 11/18/16 that showed moderate pharyngeal dysphagia with delayed swallow initiation, decreased laryngeal closure, residuals.  Suspected chronic deficits with acute exacerbations from  Dys 3/nectar recommended. RMT initiated as well during admission.    Assessment / Plan / Recommendation Clinical Impression  Pt demonstrates appearance of acutely worsened, chronic dysphagia in comparison with findings from prior admission. Pt now with consistent delayed cough following trials of solids, immediate coughing with thin liquids. Laryngeal elevation seems minimal suggesting generalized weakness impacting swallow. Suspect increased residuals and aspiration of residue. Attempted curing for effortful swallow, multiple swallow and throat clear. Pt verbalizes awareness of difficulty and after discussion refuses any further objective testing of swallowing. His primary  concern is comfort and pain management. He does not want to remain NPO or consider short term alternate nutrition during a trial of therapy and medical management. Clearly communicated the risk of respiratory failure with ongoing PO intake and pt verbalized understanding and desire to continue intake with known risk. SLP discussed this with the RN and MD. Will initaite a nectar thick liquid diet with mechanical soft solids with basic aspiration precautions and training in compensatory strategies.  SLP Visit Diagnosis: Dysphagia, oropharyngeal phase (R13.12)    Aspiration Risk  Severe aspiration risk    Diet Recommendation Dysphagia 3 (Mech soft);Nectar-thick liquid   Liquid Administration via: Cup Medication Administration: Whole meds with puree Supervision: Patient able to self feed;Staff to assist with self feeding Compensations: Slow rate;Small sips/bites;Clear throat intermittently;Multiple dry swallows after each bite/sip;Effortful swallow Postural Changes: Seated upright at 90 degrees    Other  Recommendations Oral Care Recommendations: Oral care BID Other Recommendations: Order thickener from pharmacy;Prohibited food (jello, ice cream, thin soups);Remove water pitcher   Follow up Recommendations Skilled Nursing facility      Frequency and Duration min 2x/week  2 weeks       Prognosis Prognosis for Safe Diet Advancement: Guarded Barriers to Reach Goals: Severity of deficits      Swallow Study   General HPI: 76yo male with recently hospitalization for MRSA bacteremia and endocarditis who presented with AMS 2/2 hypercarbia, acute respiratory failure from a large R pleural effusion in the setting of known R lung mass. Pt underwent MBS on 11/18/16 that showed moderate pharyngeal dysphagia with delayed swallow initiation, decreased laryngeal closure, residuals.  Suspected chronic deficits with acute exacerbations from  Dys 3/nectar recommended. RMT initiated as well during admission.   Type of Study: Bedside Swallow  Evaluation Previous Swallow Assessment: see HPI Diet Prior to this Study: NPO Temperature Spikes Noted: No Respiratory Status: Nasal cannula History of Recent Intubation: No Behavior/Cognition: Alert;Cooperative;Pleasant mood Oral Cavity Assessment: Within Functional Limits Oral Care Completed by SLP: Recent completion by staff Oral Cavity - Dentition: Adequate natural dentition Vision: Functional for self-feeding Self-Feeding Abilities: Able to feed self Patient Positioning: Upright in bed Baseline Vocal Quality: Wet Volitional Cough: Congested;Strong Volitional Swallow: Able to elicit    Oral/Motor/Sensory Function Overall Oral Motor/Sensory Function: Within functional limits   Ice Chips     Thin Liquid Thin Liquid: Impaired Presentation: Cup;Self Fed Pharyngeal  Phase Impairments: Multiple swallows;Suspected delayed Swallow;Decreased hyoid-laryngeal movement;Cough - Immediate    Nectar Thick Nectar Thick Liquid: Impaired Presentation: Cup;Self Fed Pharyngeal Phase Impairments: Multiple swallows;Suspected delayed Swallow;Decreased hyoid-laryngeal movement   Honey Thick Honey Thick Liquid: Not tested   Puree Puree: Impaired Presentation: Self Fed;Spoon Pharyngeal Phase Impairments: Multiple swallows;Suspected delayed Swallow;Decreased hyoid-laryngeal movement;Cough - Delayed   Solid   GO   Solid: Impaired Presentation: Self Fed Pharyngeal Phase Impairments: Decreased hyoid-laryngeal movement       Herbie [place], MA CCC-SLP [phone]  Tykesha Konicki, Katherene Ponto 12/03/2016,10:33 AM

## 2016-12-04 ENCOUNTER — Inpatient Hospital Stay (HOSPITAL_COMMUNITY): Payer: Medicare PPO

## 2016-12-04 DIAGNOSIS — R918 Other nonspecific abnormal finding of lung field: Secondary | ICD-10-CM

## 2016-12-04 DIAGNOSIS — Z515 Encounter for palliative care: Secondary | ICD-10-CM

## 2016-12-04 DIAGNOSIS — R6521 Severe sepsis with septic shock: Secondary | ICD-10-CM

## 2016-12-04 DIAGNOSIS — A419 Sepsis, unspecified organism: Secondary | ICD-10-CM

## 2016-12-04 LAB — CBC
HEMATOCRIT: 31.3 % — AB (ref 39.0–52.0)
HEMOGLOBIN: 9.6 g/dL — AB (ref 13.0–17.0)
MCH: 25.9 pg — AB (ref 26.0–34.0)
MCHC: 30.7 g/dL (ref 30.0–36.0)
MCV: 84.4 fL (ref 78.0–100.0)
Platelets: 204 10*3/uL (ref 150–400)
RBC: 3.71 MIL/uL — ABNORMAL LOW (ref 4.22–5.81)
RDW: 16.6 % — AB (ref 11.5–15.5)
WBC: 7.1 10*3/uL (ref 4.0–10.5)

## 2016-12-04 LAB — MAGNESIUM: Magnesium: 2 mg/dL (ref 1.7–2.4)

## 2016-12-04 LAB — GLUCOSE, CAPILLARY
GLUCOSE-CAPILLARY: 77 mg/dL (ref 65–99)
Glucose-Capillary: 84 mg/dL (ref 65–99)
Glucose-Capillary: 86 mg/dL (ref 65–99)
Glucose-Capillary: 136 mg/dL — ABNORMAL HIGH (ref 65–99)
Glucose-Capillary: 109 mg/dL — ABNORMAL HIGH (ref 65–99)

## 2016-12-04 LAB — BODY FLUID CULTURE: CULTURE: NO GROWTH

## 2016-12-04 LAB — BASIC METABOLIC PANEL
ANION GAP: 8 (ref 5–15)
BUN: 21 mg/dL — AB (ref 6–20)
CHLORIDE: 99 mmol/L — AB (ref 101–111)
CO2: 31 mmol/L (ref 22–32)
Calcium: 8.1 mg/dL — ABNORMAL LOW (ref 8.9–10.3)
Creatinine, Ser: 1.01 mg/dL (ref 0.61–1.24)
GFR calc Af Amer: >60 mL/min (ref >60)
GLUCOSE: 92 mg/dL (ref 65–99)
POTASSIUM: 4.1 mmol/L (ref 3.5–5.1)
Sodium: 138 mmol/L (ref 135–145)

## 2016-12-04 LAB — HEPARIN LEVEL (UNFRACTIONATED)
HEPARIN UNFRACTIONATED: 0.28 [IU]/mL — AB (ref 0.30–0.70)
Heparin Unfractionated: 0.29 IU/mL — ABNORMAL LOW (ref 0.30–0.70)

## 2016-12-04 LAB — PHOSPHORUS: Phosphorus: 3.8 mg/dL (ref 2.5–4.6)

## 2016-12-04 MED ORDER — ONDANSETRON HCL 4 MG/2ML IJ SOLN: 4.0000 mg | Freq: Four times a day (QID) | INTRAMUSCULAR | Status: DC

## 2016-12-04 MED ORDER — PRO-STAT SUGAR FREE PO LIQD
30.0000 mL | Freq: Two times a day (BID) | ORAL | Status: DC
  Administered 2016-12-04: 30 mL
  Filled 2016-12-04 (×2): qty 30

## 2016-12-04 MED ORDER — VITAL HIGH PROTEIN PO LIQD
1000.0000 mL | ORAL | Status: DC
  Administered 2016-12-04: 1000 mL

## 2016-12-04 MED ORDER — PRO-STAT SUGAR FREE PO LIQD
30.0000 mL | Freq: Every day | ORAL | Status: DC
  Administered 2016-12-05 – 2016-12-06 (×2): 30 mL
  Filled 2016-12-04 (×2): qty 30

## 2016-12-04 MED ORDER — JEVITY 1.2 CAL PO LIQD
1000.0000 mL | ORAL | Status: DC
  Administered 2016-12-04 – 2016-12-05 (×2): 1000 mL
  Filled 2016-12-04 (×6): qty 1000

## 2016-12-04 MED ORDER — ONDANSETRON HCL 4 MG/2ML IJ SOLN
4.0000 mg | Freq: Four times a day (QID) | INTRAMUSCULAR | Status: DC | PRN
  Administered 2016-12-04: 4 mg via INTRAVENOUS
  Filled 2016-12-04: qty 2

## 2016-12-04 NOTE — Progress Notes (Signed)
[facility] Team called and updated

## 2016-12-04 NOTE — Consult Note (Signed)
Consultation Note Date: 12/04/2016   Patient Name: Mike Becker  DOB: 1940/11/10  MRN: [id number]  Age / Sex: 76 y.o., male  PCP: Patient, No Pcp Per Referring Physician: Chesley Mires, MD  Reason for Consultation: Establishing goals of care and Psychosocial/spiritual support  HPI/Patient Profile: 76 y.o. male  with past medical history of COPD, coronary artery disease status post CABG 2004, hypertension, atrial fibrillation on Xarelto, recent hospitalization 11/13/2016 to 11/19/2016 for MRSA bacteremia/endocarditis, large right empyema in setting of right lung mass ( new finding in July) admitted on 11/08/2016 with altered mental status, syncopal episodes from SNF. Upon arrival to the emergency room patient was hypoxic with his oxygen sats in the 80s with increased work of breathing. He required intubation in the emergency department.  Previous hospitalization, TEE revealed vegetation on aortic, mitral valves, and large right empyema was drained. A PICC line was placed and he was discharged on vancomycin to  a skilled nursing facility. 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  He is now extubated. He is short of breath at rest. On 12/03/2016 patient got out of bed with the assistance of physical therapy but his sats  dropped to the 70s, heart rate increased to 140 and he was briefly placed on BiPAP again. Patient has been struggling with usage of BiPAP. Patient also evaluated by speech therapy and found to have dysphagia. Per speech therapy notes patient verbalized that he was hungry and wanted to eat regardless of the risk of aspiration.  Clinical Assessment and Goals of Care: Met with patient's son and patient today for introduction to palliative care, goals of care discussion. This initial meeting  was aimed at eliciting goals, patient's views on quality of life, and establishing rapport. Patient's son, Chriss Czar, is struggling with end-of-life decisions. Patient has 4 siblings that have been very vocal about treatment options specifically artificial nutrition and hydration. Ron shares that a family member told him he was "starving his father to death". Ron shares with me that he knows his father is dying and that he doesn't have much longer. Patient's wife, died from lung cancer as well.  During our meeting, Dr. Halford Chessman with critical care medicine also entered discussion regarding pt's clinical status, options of straight palliative approach vs. continuing to try and treat infection, starting temporary nutrition via temporary feeding tube. Patient agreed to this.  Patient does exhibit some mild confusion but is answering most questions relevantly. His healthcare proxy in the event that he would be unable to make decisions for himself is his son Beniah Magnan    SUMMARY OF RECOMMENDATIONS   Continue to treat the treatable: Antibiotics, IV fluids and tube feedings via NG tube or Cortrak Initial palliative care meeting focused on introduction of service and support of patient and family, eliciting goals relevant to patient, discussion regarding quality of life Continue with partial code: BiPAP, pressors but no CPR, no intubation, no defibrillation Palliative medicine to  stay involved and assist patient and family Code Status/Advance Care Planning:  Limited code    Symptom Management:   Dyspnea: Continue with BiPAP intermittently as well as targeted pulmonary support as directed by critical care medicine  Palliative Prophylaxis:   Aspiration, Bowel Regimen, Delirium Protocol, Frequent Pain Assessment and Turn Reposition  Additional Recommendations (Limitations, Scope, Preferences):  Full Scope Treatment; no intubation, no CPR, no defibrillation  Psycho-social/Spiritual:   Desire for further  Chaplaincy support:no  Additional Recommendations: ICU Family Guide  Prognosis:   Unable to determine. Given patient's overall clinical picture with bacteremia, endocarditis, new large right lung mass consistent with bronchogenic carcinoma I would not be surprised if patient had a prognosis of weeks to months. He would meet his hospice Medicare benefit if this were to be his goal  Discharge Planning: To Be Determined      Primary Diagnoses: Present on Admission: . Acute respiratory failure ([place])   I have reviewed the medical record, interviewed the patient and family, and examined the patient. The following aspects are pertinent.  Past Medical History:  Diagnosis Date  . A-fib ([place])   . COPD (chronic obstructive pulmonary disease) ([place])   . Dyspnea   . Dysrhythmia   . Hypertension   . MI (myocardial infarction) [facility])    Social History   Social History  . Marital status: Single    Spouse name: N/A  . Number of children: N/A  . Years of education: N/A   Social History Main Topics  . Smoking status: Current Every Day Smoker    Packs/day: 1.00    Years: 50.00    Types: Cigarettes  . Smokeless tobacco: Never Used  . Alcohol use No  . Drug use: No  . Sexual activity: Not Asked   Other Topics Concern  . None   Social History Narrative  . None   Family History  Problem Relation Age of Onset  . Diabetes Mother   . Congestive Heart Failure Mother   . Congenital heart disease Mother   . Diabetes Brother    Scheduled Meds: . Chlorhexidine Gluconate Cloth  6 each Topical Daily  . feeding supplement (PRO-STAT SUGAR FREE 64)  30 mL Per Tube BID  . feeding supplement (VITAL HIGH PROTEIN)  1,000 mL Per Tube Q24H  . insulin aspart  0-15 Units Subcutaneous Q4H  . mouth rinse  15 mL Mouth Rinse BID  . metoprolol tartrate  2.5 mg Intravenous Q6H  . nicotine  7 mg Transdermal Daily  . sodium chloride flush  10-40 mL Intracatheter Q12H   Continuous Infusions: . sodium  chloride    . heparin 1,800 Units/hr (12/04/16 0538)  . piperacillin-tazobactam (ZOSYN)  IV 3.375 g (12/04/16 0905)  . vancomycin Stopped (12/04/16 0013)   PRN Meds:.sodium chloride, albuterol, fentaNYL (SUBLIMAZE) injection, RESOURCE THICKENUP CLEAR, sodium chloride flush Medications Prior to Admission:  Prior to Admission medications   Medication Sig Start Date End Date Taking? Authorizing Provider  acetaminophen (TYLENOL) 325 MG tablet Take 2 tablets (650 mg total) by mouth every 6 (six) hours as needed for mild pain (or Fever >/= 101). 11/19/16  Yes Buriev, Arie Sabina, MD  amiodarone (PACERONE) 200 MG tablet Take 1 tablet (200 mg total) by mouth daily. 11/19/16  Yes Buriev, Arie Sabina, MD  furosemide (LASIX) 40 MG tablet Take 40 mg by mouth.   Yes [provider]  gabapentin (NEURONTIN) 600 MG tablet Take 1 tablet (600 mg total) by mouth 3 (three) times daily. 11/19/16  Yes Kinnie Feil, MD  heparin flush 10 UNIT/ML SOLN injection Inject 10 Units into the vein 2 (two) times daily.   Yes [provider]  levalbuterol (XOPENEX) 0.63 MG/3ML nebulizer solution Take 3 mLs (0.63 mg total) by nebulization every 6 (six) hours as needed for wheezing or shortness of breath. 11/19/16  Yes Buriev, Arie Sabina, MD  metoprolol tartrate (LOPRESSOR) 25 MG tablet Take 1 tablet (25 mg total) by mouth 2 (two) times daily. 11/19/16  Yes Buriev, Arie Sabina, MD  mometasone (ASMANEX) 220 MCG/INH inhaler Inhale 2 puffs into the lungs at bedtime. Rinse mouth well with water after each use 11/19/16  Yes Buriev, Arie Sabina, MD  nicotine (NICODERM CQ - DOSED IN MG/24 HOURS) 14 mg/24hr patch Place 14 mg onto the skin daily.   Yes [provider]  Omega-3 Fatty Acids (OMEGA 3 PO) Take 1 capsule by mouth daily.   Yes [provider]  rivaroxaban (XARELTO) 20 MG TABS tablet Take 1 tablet (20 mg total) by mouth daily with supper. 11/19/16  Yes Buriev, Arie Sabina, MD  sodium chloride 0.9 % injection  Inject 10 mLs into the vein 2 (two) times daily. Flush picc line after ABT infusion   Yes [provider]  tamsulosin (FLOMAX) 0.4 MG CAPS capsule Take 1 capsule (0.4 mg total) by mouth daily. 11/19/16  Yes Buriev, Arie Sabina, MD  Tiotropium Bromide-Olodaterol (STIOLTO RESPIMAT) 2.5-2.5 MCG/ACT AERS Inhale 2 puffs into the lungs daily. 11/19/16  Yes Buriev, Arie Sabina, MD  Vancomycin (VANCOCIN) 750-5 MG/150ML-% SOLN Inject 150 mLs (750 mg total) into the vein every 12 (twelve) hours. 11/19/16 12/25/16 Yes Buriev, Arie Sabina, MD   No Known Allergies Review of Systems  Unable to perform ROS: Acuity of condition    Physical Exam  Constitutional: He appears well-developed.  Acutely ill appearing older man seen in ICU; he is restless, agitated  HENT:  Head: Normocephalic and atraumatic.  Cardiovascular:  Tachycardic, irregular  Pulmonary/Chest:  Increased work of breathing at rest  Musculoskeletal: Normal range of motion.  Neurological:  Patient is alert but has a fluctuating level of consciousness; can participate in assessment and answer only limited short answer questions at this point  Skin: Skin is warm and dry. There is pallor.  Psychiatric:  Restless, agitated  Nursing note and vitals reviewed.   Vital Signs: BP 105/70 (BP Location: Left Arm)   Pulse (!) 102   Temp 97.6 F (36.4 C) (Oral)   Resp 18   Ht 6' 3"  (1.905 m)   Wt 84.4 kg (186 lb 1.1 oz)   SpO2 100%   BMI 23.26 kg/m  Pain Assessment: No/denies pain   Pain Score: 0-No pain   SpO2: SpO2: 100 % O2 Device:SpO2: 100 % O2 Flow Rate: .O2 Flow Rate (L/min): 8 L/min  IO: Intake/output summary:  Intake/Output Summary (Last 24 hours) at 12/04/16 1116 Last data filed at 12/04/16 0800  Gross per 24 hour  Intake           649.65 ml  Output              825 ml  Net          -175.35 ml    LBM: Last BM Date: 12/02/16 Baseline Weight: Weight: 91.6 kg (201 lb 15.1 oz) Most recent weight: Weight: 84.4 kg (186 lb  1.1 oz)     Palliative Assessment/Data:   Flowsheet Rows     Most Recent Value  Intake Tab  Referral  Department  Hospitalist  Unit at Time of Referral  ICU  Palliative Care Primary Diagnosis  Sepsis/Infectious Disease  Date Notified  12/03/16  Palliative Care Type  New Palliative care  Reason for referral  Clarify Goals of Care  Date of Admission  12/03/2016  Date first seen by Palliative Care  12/04/16  # of days Palliative referral response time  1 Day(s)  # of days IP prior to Palliative referral  3  Clinical Assessment  Palliative Performance Scale Score  30%  Pain Max last 24 hours  Not able to report  Pain Min Last 24 hours  Not able to report  Dyspnea Max Last 24 Hours  Not able to report  Dyspnea Min Last 24 hours  Not able to report  Nausea Max Last 24 Hours  Not able to report  Nausea Min Last 24 Hours  Not able to report  Anxiety Max Last 24 Hours  Not able to report  Anxiety Min Last 24 Hours  Not able to report  Other Max Last 24 Hours  Not able to report  Psychosocial & Spiritual Assessment  Palliative Care Outcomes  Patient/Family meeting held?  Yes  Who was at the meeting?  pt's son  Palliative Care Outcomes  Provided psychosocial or spiritual support  Palliative Care follow-up planned  Yes, Facility      Time In: 0930 Time Out: 1100 Time Total: 90 min Greater than 50%  of this time was spent counseling and coordinating care related to the above assessment and plan. Staffed with Dr. Halford Chessman  Signed by: Dory Horn, NP   Please contact Palliative Medicine Team phone at [phone] for questions and concerns.  For individual provider: See Shea Evans

## 2016-12-04 NOTE — Progress Notes (Signed)
Cortrak Tube Team Note:  Consult received to place a Cortrak feeding tube.   A 10 F Cortrak tube was placed in the R nare and secured with a tape (patient stated bridle caused too much pain) at 80 cm. Per the Cortrak monitor reading the tube tip is in peri-pyloric region  No x-ray is required. RN may begin using tube.    If the tube becomes dislodged please keep the tube and contact the Cortrak team at [email or web address] (password [email or web address]) for replacement.  If after hours and replacement cannot be delayed, place a NG tube and confirm placement with an abdominal x-ray.    Burtis Junes RD, LDN, CNSC Clinical Nutrition Pager: [phone] 12/04/2016 1:20 PM

## 2016-12-04 NOTE — Progress Notes (Signed)
ANTICOAGULATION CONSULT NOTE - Follow Up Consult  Pharmacy Consult for heparin Indication: atrial fibrillation  Labs:  Recent Labs  12/02/16 0223 12/03/16 0701 12/03/16 1152 12/03/16 1555 12/04/16 0351 12/04/16 0352  HGB 9.0* 10.5*  --   --   --  9.6*  HCT 29.7* 34.2*  --   --   --  31.3*  PLT 183 208  --   --   --  204  APTT 78* 59*  --   --   --   --   HEPARINUNFRC 0.90* 0.27*  --  0.20* 0.29*  --   CREATININE 1.01 1.00  --   --   --  1.01  TROPONINI  --   --  0.03*  --   --   --     Assessment: 76yo male remains slightly subtherapeutic on heparin after rate increases; Hgb down some, RN reports some blood from chest tube but no other overt signs of bleeding.  Goal of Therapy:  Heparin level 0.3-0.7 units/ml   Plan:  Will increase heparin gtt slightly to 1800 units/hr and check level in 6-8hr.  Wynona Neat, PharmD, BCPS  12/04/2016,5:36 AM

## 2016-12-04 NOTE — Progress Notes (Signed)
ANTICOAGULATION CONSULT NOTE - Follow Up Consult  Pharmacy Consult for heparin (holding xarelto) Indication: atrial fibrillation/sepsis   No Known Allergies  Patient Measurements: Height: 6\' 3"  (190.5 cm) Weight: 186 lb 1.1 oz (84.4 kg) IBW/kg (Calculated) : 84.5   Vital Signs: Temp: 98.1 F (36.7 C) (07/28 1135) Temp Source: Oral (07/28 1135) BP: 99/69 (07/28 1600) Pulse Rate: 135 (07/28 1600)  Labs:  Recent Labs  12/02/16 0223 12/03/16 0701 12/03/16 1152 12/03/16 1555 12/04/16 0351 12/04/16 0352 12/04/16 1350  HGB 9.0* 10.5*  --   --   --  9.6*  --   HCT 29.7* 34.2*  --   --   --  31.3*  --   PLT 183 208  --   --   --  204  --   APTT 78* 59*  --   --   --   --   --   HEPARINUNFRC 0.90* 0.27*  --  0.20* 0.29*  --  0.28*  CREATININE 1.01 1.00  --   --   --  1.01  --   TROPONINI  --   --  0.03*  --   --   --   --     Estimated Creatinine Clearance: 73.1 mL/min (by C-G formula based on SCr of 1.01 mg/dL).  Assessment: 18 yoM with AMS on xarelto PTA for afib. S/p kcentra 7/25 am 2/2 hgb drop and ?hemothorax. -heparin level is below goal at 0.28  Goal of Therapy:  Heparin level 0.3-0.7 units/ml Monitor platelets by anticoagulation protocol: Yes   Plan:  -Increase heparin to 1950 units/hr -heparin level and CBC daily  Hildred Laser, Pharm D 12/04/2016 4:11 PM

## 2016-12-04 NOTE — Progress Notes (Signed)
PULMONARY / CRITICAL CARE MEDICINE   Name: Mike Becker MRN: [id number] DOB: 03-Jul-1940    ADMISSION DATE:  11/29/2016 CONSULTATION DATE:  12/01/2016  REFERRING MD:  Dr. Winfred Leeds  CHIEF COMPLAINT:  AMS  Brief:   76 yo male with PMH CAD s/p CABG 2004, COPD, HTN, Afib on xarelto and recent hospitalization 7/7-7/13 for MRSA bacteremia/endocarditis. Also had large right empyema which was drained in the setting of a R lung mass.  Presents 7/24 from SNF with AMS and multiple syncopal episodes. Upon arrival hypoxic in the low 80s with increased WOB, intubated in the ED.   SUBJECTIVE:  Has trouble with cough and swallowing.  VITAL SIGNS: BP 105/70 (BP Location: Left Arm)   Pulse (!) 102   Temp 97.6 F (36.4 C) (Oral)   Resp 18   Ht 6\' 3"  (1.905 m)   Wt 186 lb 1.1 oz (84.4 kg)   SpO2 100%   BMI 23.26 kg/m   INTAKE / OUTPUT: I/O last 3 completed shifts: In: 1080.6 [I.V.:630.6; IV Piggyback:450] Out: 1100 [Urine:400; Chest Tube:700]  PHYSICAL EXAMINATION:  General - alert Eyes - pupils reactive ENT - no sinus tenderness, no oral exudate, no LAN Cardiac - regular, no murmur Chest - decreased BS on Rt, Rt chest tube in Abd - soft, non tender Ext - ankle edema Skin - Lt antecubital redness improved Neuro - follows commands  LABS:  BMET  Recent Labs Lab 12/02/16 0223 12/03/16 0701 12/04/16 0352  NA 134* 135 138  K 4.5 4.4 4.1  CL 97* 99* 99*  CO2 29 31 31   BUN 19 18 21*  CREATININE 1.01 1.00 1.01  GLUCOSE 92 94 92    Electrolytes  Recent Labs Lab 12/02/16 0223 12/03/16 0701 12/04/16 0352  CALCIUM 7.6* 8.2* 8.1*  MG 1.7 2.0  --   PHOS 3.7 3.6  --     CBC  Recent Labs Lab 12/02/16 0223 12/03/16 0701 12/04/16 0352  WBC 7.0 7.4 7.1  HGB 9.0* 10.5* 9.6*  HCT 29.7* 34.2* 31.3*  PLT 183 208 204    Coag's  Recent Labs Lab 12/01/16 0008 12/01/16 0209 12/02/16 0223 12/03/16 0701  APTT 34  --  78* 59*  INR 1.73 1.44  --   --      Sepsis Markers  Recent Labs Lab 12/01/2016 1721 12/01/16 1500 12/02/16 0223  LATICACIDVEN 1.92* 2.2* 1.8    ABG  Recent Labs Lab 11/17/2016 1729 11/26/2016 1828 12/01/16 0002  PHART 7.235* 7.345* 7.401  PCO2ART 83.9* 63.4* 51.1*  PO2ART 85.0 288.0* 117.0*    Liver Enzymes  Recent Labs Lab 12/04/2016 1710  AST 29  ALT 23  ALKPHOS 172*  BILITOT 0.9  ALBUMIN 1.8*    Cardiac Enzymes  Recent Labs Lab 11/14/2016 2210 12/03/16 1152  TROPONINI <0.03 0.03*    Glucose  Recent Labs Lab 12/03/16 1143 12/03/16 1529 12/03/16 1951 12/03/16 2338 12/04/16 0340 12/04/16 0825  GLUCAP 144* 93 94 83 84 86    Imaging Dg Chest Port 1 View  Result Date: 12/04/2016 CLINICAL DATA:  Chest tube EXAM: PORTABLE CHEST 1 VIEW COMPARISON:  12/03/2016 FINDINGS: Right basilar chest tube remains in place. Large right pleural effusion. No pneumothorax. Prior CABG. Cardiomegaly with bilateral interstitial and alveolar opacities, likely edema. More confluent opacity in the right lower lobe, stable. IMPRESSION: No significant change since prior study. Electronically Signed   By: Rolm Baptise M.D.   On: 12/04/2016 07:51     STUDIES:  CT chest 7/8>  Large complex diffusely heterogeneous mass at R hilium 8.8 x 8.1 x 6.5 cm, with multiple areas of central necrosis c/w primary bronchogenic malignancy. Enlarged mediastinal nodes. Scattered small left-sided pulmonary nodules. TEE 7/11 > 55-60%, wall motion normal, small highly mobile vegetations on aortic and mitral valves CXR 7/24 > Large right pleural effusion with near complete passive atelectasis in the right lung. Areas of atelectasis and/or airspace consolidation in the left lower lobe. RUQ [place] 7/24 > Increased hepatic echogenicity suggesting fatty infiltration or hepatocellular disease. No focal abnormality. TTE 7/25 > EF 60-65%, Moderate Concentric Hypertrophy, systolic function normal, normal wall motion.   CULTURES: Blood Cx 7/24 >   Sputum 7/24 >>  Few candida albicans Pleural fluid Cx 7/25 > negative  ANTIBIOTICS: Vanc 7/8 > with stop date of 8/18  Zosyn 7/24 >   SIGNIFICANT EVENTS: 7/24 > admitted, intubated  7/25 > Thoracentesis, extubated 7/26 > concern for aspiration 7/27 > failed swallow evaluation 7/28 > palliative care consulted  LINES/TUBES: R UE PICC 7/13 > ETT 7/24 > 7/25 Rtchest tube 7/26 >   DISCUSSION: 76yo male with recently hospitalization for MRSA bacteremia and endocarditis who presented with AMS 2/2 hypercarbia, acute respiratory failure from a large R pleural effusion in the setting of known R lung mass.  ASSESSMENT / PLAN:  PULMONARY A: Acute on chronic hypoxic/hypercapnic respiratory failure from HCAP, empyema. Lung mass with LAN >> most likely advanced lung cancer. Hx of COPD. P:   Oxygen to keep SpO2 > 90% F/u CXR Continue chest tube to -20 cm suction Will need to have further d/w pt and family about whether to pursue CT chest and possible additional chest tubes Explained he is not a candidate for any interventions for presumed lung cancer until his infections are resolved  CARDIOVASCULAR A:  A fib with RVR. Hx of CAD s/p CABG 2004, Thoracic aortic aneurysm.  P:  Heparin gtt Continue lopressor Resume amiodarone when coretrak placed  RENAL A:   No acute issues. P:   F/u BMET  GASTROINTESTINAL A:   Dysphagia. Moderate protein calorie malnutrition. P:   He has opted for short term trial of tube feedings and continue to work with speech therapy  HEMATOLOGIC A:   Anemia of critical illness. P:  F/u CBC  INFECTIOUS A:   MRSA endocarditis and bacteremia. HCAP with Rt empyema. Lt antecubital cellulitis. P:   Day 5/14 of zosyn Continue vancomycin through 8/18  ENDOCRINE A:   Hyperglycemia. P:   SSI  NEUROLOGIC A:   Acute metabolic encephalopathy. P:   Monitor mental status   GOALS OF CARE - palliative care consulted  Chesley Mires, MD [place] 12/04/2016, 9:25 AM Pager:  [phone] After 3pm call: [phone]

## 2016-12-04 NOTE — Progress Notes (Signed)
Initial Nutrition Assessment  DOCUMENTATION CODES:   Severe malnutrition in context of chronic illness  INTERVENTION:   Tube Feeding:   Jevity 1.2 @ goal of 75 ml/hr with Pro-Stat 30 mL daily providing 2260 kcals, 115 g of protein and 1458 mL of free water. Begin at 20 ml/hr, titrate by 10 mL q 4 hours until goal rate of 75 ml/hr.   Monitor magnesium, potassium, and phosphorus daily for at least 3 days, MD to replete as needed, as pt is at risk for refeeding syndrome given severe chronic malnutrition.   NUTRITION DIAGNOSIS:   Malnutrition (Severe) related to chronic illness, cancer and cancer related treatments (COPD) as evidenced by severe depletion of body fat, severe depletion of muscle mass, severe fluid accumulation.  GOAL:   Patient will meet greater than or equal to 90% of their needs  MONITOR:  TF tolerance, Labs, Weight trends, I & O's  REASON FOR ASSESSMENT:   Consult Enteral/tube feeding initiation and management  ASSESSMENT:   76 yo male admitted with acute respiratory failure with large R pleural effusion, known R lung mass c/w primary bronchogenic malignancy, COPD; MRSA bacteremia/endocarditis.  Pt with hx of CAD s/p CABG, COPD, HTN  7/24 Intubated 7/25 Thoracentesis with 600 mL removed, Extubated 7/26 Chest tube placed for R empyema 7/27 Failed swallow assessment 7/28 Plan for Cortrak placement today with initiation of EN  Last meal was Tuesday Lunch PTA. Appetite has not been great but would not elaborate on visit today  Pt and family are unsure if pt has lost weight due to recent fluid gains with weight gain. They assume however that pt has lost weight. Current wt 186 pounds, unsure of dry weight  Nutrition-Focused physical exam completed. Findings are mild/moderate to severe fat depletion, mild/moderate to severe muscle depletion, and severe edema.   Labs: reviewed Meds: reviewed  Diet Order:   NPO  Skin:  Reviewed, no issues  Last BM:   7/26  Height:   Ht Readings from Last 1 Encounters:  11/07/2016 6\' 3"  (1.905 m)    Weight:   Wt Readings from Last 1 Encounters:  12/04/16 186 lb 1.1 oz (84.4 kg)    Ideal Body Weight:     BMI:  Body mass index is 23.26 kg/m.  Estimated Nutritional Needs:   Kcal:  2060-2300 kcals  Protein:  110-135 g  Fluid:  >/= 2 L  EDUCATION NEEDS:   No education needs identified at this time  [place], [place], LDN [phone] Pager  [phone] Weekend/On-Call Pager

## 2016-12-04 NOTE — Progress Notes (Signed)
Pt c/o nausea. Med given. Nursing will cont to monitor, pt's sisters' at [facility].

## 2016-12-05 ENCOUNTER — Inpatient Hospital Stay (HOSPITAL_COMMUNITY): Payer: Medicare PPO

## 2016-12-05 ENCOUNTER — Encounter (HOSPITAL_COMMUNITY): Payer: Self-pay | Admitting: *Deleted

## 2016-12-05 LAB — CULTURE, BLOOD (ROUTINE X 2)
CULTURE: NO GROWTH
CULTURE: NO GROWTH
SPECIAL REQUESTS: ADEQUATE
Special Requests: ADEQUATE

## 2016-12-05 LAB — GLUCOSE, CAPILLARY
GLUCOSE-CAPILLARY: 127 mg/dL — AB (ref 65–99)
GLUCOSE-CAPILLARY: 164 mg/dL — AB (ref 65–99)
Glucose-Capillary: 121 mg/dL — ABNORMAL HIGH (ref 65–99)
Glucose-Capillary: 141 mg/dL — ABNORMAL HIGH (ref 65–99)
Glucose-Capillary: 141 mg/dL — ABNORMAL HIGH (ref 65–99)
Glucose-Capillary: 145 mg/dL — ABNORMAL HIGH (ref 65–99)
Glucose-Capillary: 148 mg/dL — ABNORMAL HIGH (ref 65–99)

## 2016-12-05 LAB — BASIC METABOLIC PANEL
Anion gap: 5 (ref 5–15)
BUN: 28 mg/dL — ABNORMAL HIGH (ref 6–20)
CHLORIDE: 102 mmol/L (ref 101–111)
CO2: 32 mmol/L (ref 22–32)
CREATININE: 1 mg/dL (ref 0.61–1.24)
Calcium: 8.2 mg/dL — ABNORMAL LOW (ref 8.9–10.3)
Glucose, Bld: 151 mg/dL — ABNORMAL HIGH (ref 65–99)
POTASSIUM: 4.1 mmol/L (ref 3.5–5.1)
SODIUM: 139 mmol/L (ref 135–145)

## 2016-12-05 LAB — CBC
HCT: 32.2 % — ABNORMAL LOW (ref 39.0–52.0)
HEMOGLOBIN: 9.5 g/dL — AB (ref 13.0–17.0)
MCH: 25.4 pg — AB (ref 26.0–34.0)
MCHC: 29.5 g/dL — ABNORMAL LOW (ref 30.0–36.0)
MCV: 86.1 fL (ref 78.0–100.0)
Platelets: 215 10*3/uL (ref 150–400)
RBC: 3.74 MIL/uL — AB (ref 4.22–5.81)
RDW: 16.7 % — ABNORMAL HIGH (ref 11.5–15.5)
WBC: 8.3 10*3/uL (ref 4.0–10.5)

## 2016-12-05 LAB — HEPARIN LEVEL (UNFRACTIONATED)
HEPARIN UNFRACTIONATED: 0.73 [IU]/mL — AB (ref 0.30–0.70)
Heparin Unfractionated: 0.42 IU/mL (ref 0.30–0.70)

## 2016-12-05 LAB — PHOSPHORUS
PHOSPHORUS: 3.4 mg/dL (ref 2.5–4.6)
Phosphorus: 3.8 mg/dL (ref 2.5–4.6)

## 2016-12-05 LAB — VANCOMYCIN, TROUGH: VANCOMYCIN TR: 19 ug/mL (ref 15–20)

## 2016-12-05 LAB — MAGNESIUM
MAGNESIUM: 2.2 mg/dL (ref 1.7–2.4)
Magnesium: 2 mg/dL (ref 1.7–2.4)

## 2016-12-05 MED ORDER — IOPAMIDOL (ISOVUE-300) INJECTION 61%
INTRAVENOUS | Status: AC
  Administered 2016-12-05: 75 mL
  Filled 2016-12-05: qty 75

## 2016-12-05 MED ORDER — AMIODARONE HCL 200 MG PO TABS
200.0000 mg | ORAL_TABLET | Freq: Every day | ORAL | Status: DC
  Administered 2016-12-05 – 2016-12-06 (×2): 200 mg
  Filled 2016-12-05 (×3): qty 1

## 2016-12-05 MED ORDER — METOPROLOL TARTRATE 25 MG/10 ML ORAL SUSPENSION
50.0000 mg | Freq: Two times a day (BID) | ORAL | Status: DC
  Administered 2016-12-05 – 2016-12-06 (×3): 50 mg
  Filled 2016-12-05 (×4): qty 20

## 2016-12-05 NOTE — Progress Notes (Signed)
PULMONARY / CRITICAL CARE MEDICINE   Name: Mike Becker MRN: [id number] DOB: 1941/02/20    ADMISSION DATE:  11/15/2016 CONSULTATION DATE:  11/29/2016  REFERRING MD:  Dr. Winfred Leeds  CHIEF COMPLAINT:  AMS  Brief:   76 yo male with PMH CAD s/p CABG 2004, COPD, HTN, Afib on xarelto and recent hospitalization 7/7-7/13 for MRSA bacteremia/endocarditis. Also had large right empyema which was drained in the setting of a R lung mass.  Presents 7/24 from SNF with AMS and multiple syncopal episodes. Upon arrival hypoxic in the low 80s with increased WOB, intubated in the ED.   SUBJECTIVE:  Had coretrak placed.  VITAL SIGNS: BP (!) 128/105   Pulse (!) 120   Temp (!) 97.5 F (36.4 C) (Oral)   Resp (!) 23   Ht 6\' 3"  (1.905 m)   Wt 195 lb 8.8 oz (88.7 kg)   SpO2 100%   BMI 24.44 kg/m   INTAKE / OUTPUT: I/O last 3 completed shifts: In: 2160.9 [I.V.:638.9; NG/GT:972; IV Piggyback:550] Out: 1320 [Urine:700; Emesis/NG output:50; Chest Tube:570]  PHYSICAL EXAMINATION:  General - pleasant Eyes - pupils reactive ENT - NG tube in Cardiac - regular, no murmur Chest - decreased BS on Rt, b/l crackles, Rt chest tube Abd - soft, non tender Ext - no edema Skin - decreased erythema Lt antecubital area Neuro - normal strength   LABS:  BMET  Recent Labs Lab 12/03/16 0701 12/04/16 0352 12/05/16 0350  NA 135 138 139  K 4.4 4.1 4.1  CL 99* 99* 102  CO2 31 31 32  BUN 18 21* 28*  CREATININE 1.00 1.01 1.00  GLUCOSE 94 92 151*    Electrolytes  Recent Labs Lab 12/03/16 0701 12/04/16 0352 12/04/16 1350 12/05/16 0350  CALCIUM 8.2* 8.1*  --  8.2*  MG 2.0  --  2.0 2.0  PHOS 3.6  --  3.8 3.8    CBC  Recent Labs Lab 12/03/16 0701 12/04/16 0352 12/05/16 0350  WBC 7.4 7.1 8.3  HGB 10.5* 9.6* 9.5*  HCT 34.2* 31.3* 32.2*  PLT 208 204 215    Coag's  Recent Labs Lab 12/01/16 0008 12/01/16 0209 12/02/16 0223 12/03/16 0701  APTT 34  --  78* 59*  INR 1.73 1.44   --   --     Sepsis Markers  Recent Labs Lab 11/17/2016 1721 12/01/16 1500 12/02/16 0223  LATICACIDVEN 1.92* 2.2* 1.8    ABG  Recent Labs Lab 11/26/2016 1729 11/10/2016 1828 12/01/16 0002  PHART 7.235* 7.345* 7.401  PCO2ART 83.9* 63.4* 51.1*  PO2ART 85.0 288.0* 117.0*    Liver Enzymes  Recent Labs Lab 12/04/2016 1710  AST 29  ALT 23  ALKPHOS 172*  BILITOT 0.9  ALBUMIN 1.8*    Cardiac Enzymes  Recent Labs Lab 11/20/2016 2210 12/03/16 1152  TROPONINI <0.03 0.03*    Glucose  Recent Labs Lab 12/04/16 1137 12/04/16 1613 12/04/16 2007 12/05/16 0005 12/05/16 0408 12/05/16 0837  GLUCAP 77 136* 109* 141* 148* 141*    Imaging Dg Chest Port 1 View  Result Date: 12/05/2016 CLINICAL DATA:  Empyema EXAM: PORTABLE CHEST 1 VIEW COMPARISON:  12/04/2016 FINDINGS: Right basilar chest tube and right PICC line remain in place, unchanged. No pneumothorax. Continued moderate to large right effusion and diffuse airspace opacities bilaterally, right greater than left. Underlying COPD. Heart is mildly enlarged. IMPRESSION: No significant change since prior study.  No pneumothorax. Electronically Signed   By: Rolm Baptise M.D.   On: 12/05/2016 07:25  STUDIES:  CT chest 7/8> Large complex diffusely heterogeneous mass at R hilium 8.8 x 8.1 x 6.5 cm, with multiple areas of central necrosis c/w primary bronchogenic malignancy. Enlarged mediastinal nodes. Scattered small left-sided pulmonary nodules. TEE 7/11 > 55-60%, wall motion normal, small highly mobile vegetations on aortic and mitral valves CXR 7/24 > Large right pleural effusion with near complete passive atelectasis in the right lung. Areas of atelectasis and/or airspace consolidation in the left lower lobe. RUQ [place] 7/24 > Increased hepatic echogenicity suggesting fatty infiltration or hepatocellular disease. No focal abnormality. TTE 7/25 > EF 60-65%, Moderate Concentric Hypertrophy, systolic function normal, normal wall  motion.   CULTURES: Blood Cx 7/24 >  Sputum 7/24 >>  Few candida albicans Pleural fluid Cx 7/25 > negative  ANTIBIOTICS: Vanc 7/8 > with stop date of 8/18  Zosyn 7/24 >   SIGNIFICANT EVENTS: 7/24 > admitted, intubated  7/25 > Thoracentesis, extubated 7/26 > concern for aspiration 7/27 > failed swallow evaluation 7/28 > palliative care consulted  LINES/TUBES: R UE PICC 7/13 > ETT 7/24 > 7/25 Rtchest tube 7/26 >   DISCUSSION: 76yo male with recently hospitalization for MRSA bacteremia and endocarditis who presented with AMS 2/2 hypercarbia, acute respiratory failure from a large R pleural effusion in the setting of known R lung mass.  ASSESSMENT / PLAN:  PULMONARY A: Acute on chronic hypoxic/hypercapnic respiratory failure from HCAP, empyema. Lung mass with LAN >> most likely advanced lung cancer. Hx of COPD. P:   F/u CT chest with contrast and then decide if he needs IR to assess for additional chest tube, or if he needs tPA/DNAse Continue chest tube to -20 cm suction Bipap prn Oxygen to keep SpO2 > 90% DNI Explained he is not a candidate for any interventions for presumed lung cancer until his infections are resolved  CARDIOVASCULAR A:  A fib with RVR. Hx of CAD s/p CABG 2004, Thoracic aortic aneurysm.  P:  Heparin gtt per pharmacy Continue lopressor, amiodarone  RENAL A:   No acute issues. P:   F/u BMET  GASTROINTESTINAL A:   Dysphagia. Moderate protein calorie malnutrition. P:   Continue tube feeds F/u with speech therapy  HEMATOLOGIC A:   Anemia of critical illness. P:  F/u CBC  INFECTIOUS A:   MRSA endocarditis and bacteremia. HCAP with Rt empyema. Lt antecubital cellulitis. P:   Day 6/14 zosyn Continue vancomycin through 8/18  ENDOCRINE A:   Hyperglycemia. P:   SSI  NEUROLOGIC A:   Acute metabolic encephalopathy >> much improved. P:   Monitor mental status   GOALS OF CARE - DNR/DNI - continue current medical therapies -  palliative care consulted  Updated pt's family at bedside  Chesley Mires, MD [place] 12/05/2016, 11:00 AM Pager:  [phone] After 3pm call: [phone]

## 2016-12-05 NOTE — Progress Notes (Addendum)
Pharmacy Antibiotic Note  Mike Becker is a 76 y.o. male admitted on 11/20/2016 with MRSA endocarditis and bacteremia.  Patient is currently on IV vancomycin and Zosyn. WBC wnl. SCR stable at 1. A vanc trough drawn today was therapeutic at 19 after recent dose decrease. UOP has been trending down   Plan: Continue vancomycin 500 mg IV Q 12 hours  Continue Zosyn 3.375 gm IV Q 8 hours  Monitor SCr and urine output.   Height: 6\' 3"  (190.5 cm) Weight: 195 lb 8.8 oz (88.7 kg) IBW/kg (Calculated) : 84.5  Temp (24hrs), Avg:97.7 F (36.5 C), Min:97.3 F (36.3 C), Max:98.2 F (36.8 C)   Recent Labs Lab 11/07/2016 1721  12/01/16 0817 12/01/16 1500 12/02/16 0223 12/03/16 0701 12/04/16 0352 12/05/16 0350 12/05/16 1122  WBC  --   < > 7.7  --  7.0 7.4 7.1 8.3  --   CREATININE  --   --  1.05  --  1.01 1.00 1.01 1.00  --   LATICACIDVEN 1.92*  --   --  2.2* 1.8  --   --   --   --   VANCOTROUGH  --   --   --   --   --  24*  --   --  19  < > = values in this interval not displayed.  Estimated Creatinine Clearance: 73.9 mL/min (by C-G formula based on SCr of 1 mg/dL).    No Known Allergies  Antimicrobials this admission: 7/24 Zosyn >> 7/24 vancomycin >> (planned 8/18)  Dose adjustments this admission:   Microbiology results: 7/24 resp >> few candida  7/24 blood x 2 >> ngtd  7/25 pleural >> ngtd   Thank you for allowing pharmacy to be a part of this patient's care.  Albertina Parr, PharmD., BCPS Clinical Pharmacist Pager [phone]  Anticoag Follow up: Pt is on heparin for Afib. HL this AM was therapeutic on 1950 units/hr at 0.42. H/H stable, Plt wnl. Will continue current infusion rate and recheck level this afternoon.   Albertina Parr, PharmD., BCPS Clinical Pharmacist Pager [phone]

## 2016-12-05 NOTE — Progress Notes (Signed)
Daily Progress Note   Patient Name: Mike Becker       Date: 12/05/2016 DOB: 25-Jan-1941  Age: 76 y.o. MRN#: [id number] Attending Physician: Chesley Mires, MD Primary Care Physician: Patient, No Pcp Per Admit Date: 11/14/2016  Reason for Consultation/Follow-up: Establishing goals of care, Interfamily conflict and Psychosocial/spiritual support  Subjective: Patient was started on tube feedings yesterday and thus far is tolerating that. He continues to have upper airway secretions. Met with patient's son, Chriss Czar, as well as aunt Judson Roch per son's request. Reviewed clinical data regarding clinical picture of endocarditis, bacteremia in the setting of a new diagnosis of lung cancer he was not on BiPAP overnight. No current complaints of pain or shortness of breath  Length of Stay: 5  Current Medications: Scheduled Meds:  . Chlorhexidine Gluconate Cloth  6 each Topical Daily  . feeding supplement (PRO-STAT SUGAR FREE 64)  30 mL Per Tube Daily  . insulin aspart  0-15 Units Subcutaneous Q4H  . mouth rinse  15 mL Mouth Rinse BID  . metoprolol tartrate  2.5 mg Intravenous Q6H  . nicotine  7 mg Transdermal Daily  . sodium chloride flush  10-40 mL Intracatheter Q12H    Continuous Infusions: . sodium chloride    . feeding supplement (JEVITY 1.2 CAL) 1,000 mL (12/04/16 2230)  . heparin 1,950 Units/hr (12/05/16 0927)  . piperacillin-tazobactam (ZOSYN)  IV 3.375 g (12/05/16 0816)  . vancomycin Stopped (12/05/16 0033)    PRN Meds: sodium chloride, albuterol, fentaNYL (SUBLIMAZE) injection, ondansetron (ZOFRAN) IV, RESOURCE THICKENUP CLEAR, sodium chloride flush  Physical Exam  Constitutional:  Older acutely ill appearing man seen in ICU. He is alert but not his vocal as when I saw him on  12/04/2016  Pulmonary/Chest:  Upper airway secretions noted; frequent suctioning  Neurological:  Not as conversant, sleeping more  Skin: Skin is warm and dry. There is pallor.  Psychiatric:  Unable to test  Nursing note and vitals reviewed.           Vital Signs: BP (!) 128/105   Pulse (!) 120   Temp (!) 97.5 F (36.4 C) (Oral)   Resp (!) 23   Ht 6' 3"  (1.905 m)   Wt 88.7 kg (195 lb 8.8 oz)   SpO2 100%   BMI 24.44 kg/m  SpO2: SpO2: 100 %  O2 Device: O2 Device: High Flow Nasal Cannula O2 Flow Rate: O2 Flow Rate (L/min): 8 L/min  Intake/output summary:  Intake/Output Summary (Last 24 hours) at 12/05/16 1059 Last data filed at 12/05/16 2458  Gross per 24 hour  Intake           1743.5 ml  Output              770 ml  Net            973.5 ml   LBM: Last BM Date: 12/04/16 (per pt report) Baseline Weight: Weight: 91.6 kg (201 lb 15.1 oz) Most recent weight: Weight: 88.7 kg (195 lb 8.8 oz)       Palliative Assessment/Data:    Flowsheet Rows     Most Recent Value  Intake Tab  Referral Department  Hospitalist  Unit at Time of Referral  ICU  Palliative Care Primary Diagnosis  Sepsis/Infectious Disease  Date Notified  12/03/16  Palliative Care Type  New Palliative care  Reason for referral  Clarify Goals of Care  Date of Admission  11/29/2016  Date first seen by Palliative Care  12/04/16  # of days Palliative referral response time  1 Day(s)  # of days IP prior to Palliative referral  3  Clinical Assessment  Palliative Performance Scale Score  30%  Pain Max last 24 hours  Not able to report  Pain Min Last 24 hours  Not able to report  Dyspnea Max Last 24 Hours  Not able to report  Dyspnea Min Last 24 hours  Not able to report  Nausea Max Last 24 Hours  Not able to report  Nausea Min Last 24 Hours  Not able to report  Anxiety Max Last 24 Hours  Not able to report  Anxiety Min Last 24 Hours  Not able to report  Other Max Last 24 Hours  Not able to report  Psychosocial  & Spiritual Assessment  Palliative Care Outcomes  Patient/Family meeting held?  Yes  Who was at the meeting?  pt's son  Palliative Care Outcomes  Provided psychosocial or spiritual support  Palliative Care follow-up planned  Yes, Facility      Patient Active Problem List   Diagnosis Date Noted  . Septic shock ([place])   . Mass of right lung   . Palliative care by specialist   . Empyema of right pleural space ([place])   . Acute respiratory failure ([place]) 12/05/2016  . 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 MRSA bacteremia 11/15/2016  . Pleural effusion on right   . Sepsis ([place]) 11/13/2016  . Community acquired pneumonia of right lower lobe of lung ([place]) 11/13/2016  . Parapneumonic effusion 11/13/2016  . Acute on chronic diastolic CHF (congestive heart failure) ([place]) 11/13/2016  . Elevated troponin 11/13/2016  . AKI (acute kidney injury) ([place]) 11/13/2016  . Hyponatremia 11/13/2016  . Hyperkalemia 11/13/2016  . Essential hypertension 11/13/2016  . AF (paroxysmal atrial fibrillation) ([place]) 11/13/2016  . Coronary artery disease due to lipid rich plaque 11/13/2016  . COPD (chronic obstructive pulmonary disease) ([place]) 11/13/2016    Palliative Care Assessment & Plan   Patient Profile: 76 y.o. male  with past medical history of COPD, coronary artery disease status post CABG 2004, hypertension, atrial fibrillation on Xarelto, recent hospitalization 11/13/2016 to 11/19/2016 for MRSA bacteremia/endocarditis, large right empyema in setting of right lung mass ( new finding in July) admitted on 12/07/2016 with altered mental status, syncopal episodes from SNF. Upon arrival to the emergency  room patient was hypoxic with his oxygen sats in the 80s with increased work of breathing. He required intubation in the emergency department.  Previous hospitalization, TEE revealed vegetation on aortic, mitral valves, and large right empyema was drained. A PICC line was placed and he was discharged on  vancomycin to  a skilled nursing facility. 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  He is now extubated. He is short of breath at rest. On 12/03/2016 patient got out of bed with the assistance of physical therapy but his sats  dropped to the 70s, heart rate increased to 140 and he was briefly placed on BiPAP again. Patient has been struggling with usage of BiPAP. Patient also evaluated by speech therapy and found to have dysphagia. Per speech therapy notes patient verbalized that he was hungry and wanted to eat regardless of the risk of aspiration.   Assessment: Met with patient son and patient's sister to review clinical data. Son is struggling with end-of-life decisions for his father and feels as though his father's siblings are not supportive of him. The bulk of our discussion this morning was clarifying the family's viewpoint of patient's disease, and what the patient would want in the setting of being critically ill.  Patient's son and family are likely to need a lot of support as this goes forward  Recommendations/Plan:  Palliative medicine to stay involved and to support patient and family.  At this point , plan is to continue antibiotics, tube feedings, IV fluids but patient's son, sister Judson Roch, do verbalize that they understand how critically ill he is. At this point I did share with him my concern is that patient would not survive this hospitalization. Both Ron and Judson Roch verbalized that they saw that as well. Both Ron, and Judson Roch, verbalized that patient would not want to be on life support (he is a DO NOT INTUBATE, no CPR no defibrillation), and would not want a permanent feeding tube  Code Status:    Code Status Orders        Start     Ordered   12/01/16 1446  Limited resuscitation (code)  Continuous    Question Answer Comment  In the event of cardiac or respiratory  ARREST: Initiate Code Blue, Call Rapid Response Yes   In the event of cardiac or respiratory ARREST: Perform CPR No   In the event of cardiac or respiratory ARREST: Perform Intubation/Mechanical Ventilation No   In the event of cardiac or respiratory ARREST: Use NIPPV/BiPAp only if indicated Yes   In the event of cardiac or respiratory ARREST: Administer ACLS medications if indicated Yes   In the event of cardiac or respiratory ARREST: Perform Defibrillation or Cardioversion if indicated No      12/01/16 1445    Code Status History    Date Active Date Inactive Code Status Order ID Comments User Context   12/01/2016  8:53 AM 12/01/2016  2:45 PM Full Code [id number]  Omar Person, NP Inpatient   11/25/2016  8:38 PM 12/01/2016  8:53 AM Partial Code [id number]  Bufford Lope, DO Inpatient   11/13/2016 11:34 PM 11/19/2016  9:11 PM Partial Code [id number]  Edwin Dada, MD Inpatient   11/13/2016 10:26 PM 11/13/2016 11:34 PM Full Code [id number]  Edwin Dada, MD ED       Prognosis:   Unable to determine  Discharge Planning:  To Be Determined  Thank you for allowing the Palliative Medicine Team to assist in the care of this patient.   Time In: 0900 Time Out: 1000 Total Time 60 min Prolonged Time Billed  no       Greater than 50%  of this time was spent counseling and coordinating care related to the above assessment and plan.  Dory Horn, NP  Please contact Palliative Medicine Team phone at [phone] for questions and concerns.

## 2016-12-05 NOTE — Progress Notes (Signed)
ANTICOAGULATION CONSULT NOTE - Follow Up Consult  Pharmacy Consult for heparin (holding xarelto) Indication: atrial fibrillation/sepsis   No Known Allergies  Patient Measurements: Height: 6\' 3"  (190.5 cm) Weight: 195 lb 8.8 oz (88.7 kg) IBW/kg (Calculated) : 84.5   Vital Signs: Temp: 98 F (36.7 C) (07/29 1205) Temp Source: Oral (07/29 1205) BP: 109/80 (07/29 1400) Pulse Rate: 85 (07/29 1400)  Labs:  Recent Labs  12/03/16 0701 12/03/16 1152  12/04/16 0352 12/04/16 1350 12/05/16 0350 12/05/16 1400  HGB 10.5*  --   --  9.6*  --  9.5*  --   HCT 34.2*  --   --  31.3*  --  32.2*  --   PLT 208  --   --  204  --  215  --   APTT 59*  --   --   --   --   --   --   HEPARINUNFRC 0.27*  --   < >  --  0.28* 0.42 0.73*  CREATININE 1.00  --   --  1.01  --  1.00  --   TROPONINI  --  0.03*  --   --   --   --   --   < > = values in this interval not displayed.  Estimated Creatinine Clearance: 73.9 mL/min (by C-G formula based on SCr of 1 mg/dL).  Assessment: 11 yoM with AMS on xarelto PTA for afib. S/p kcentra 7/25 am 2/2 hgb drop and ?hemothorax. -heparin level is slightly  above goal   Goal of Therapy:  Heparin level 0.3-0.7 units/ml Monitor platelets by anticoagulation protocol: Yes   Plan:  -Decrease heparin to 1900 units/hr -heparin level and CBC daily  Hildred Laser, Pharm D 12/05/2016 4:00 PM

## 2016-12-06 DIAGNOSIS — Z9911 Dependence on respirator [ventilator] status: Secondary | ICD-10-CM

## 2016-12-06 DIAGNOSIS — K7030 Alcoholic cirrhosis of liver without ascites: Secondary | ICD-10-CM

## 2016-12-06 DIAGNOSIS — Z9889 Other specified postprocedural states: Secondary | ICD-10-CM

## 2016-12-06 DIAGNOSIS — A419 Sepsis, unspecified organism: Principal | ICD-10-CM

## 2016-12-06 DIAGNOSIS — R6521 Severe sepsis with septic shock: Secondary | ICD-10-CM

## 2016-12-06 LAB — GLUCOSE, CAPILLARY
GLUCOSE-CAPILLARY: 123 mg/dL — AB (ref 65–99)
GLUCOSE-CAPILLARY: 137 mg/dL — AB (ref 65–99)
Glucose-Capillary: 134 mg/dL — ABNORMAL HIGH (ref 65–99)

## 2016-12-06 LAB — BASIC METABOLIC PANEL
ANION GAP: 4 — AB (ref 5–15)
BUN: 29 mg/dL — AB (ref 6–20)
CALCIUM: 8.2 mg/dL — AB (ref 8.9–10.3)
CO2: 34 mmol/L — ABNORMAL HIGH (ref 22–32)
Chloride: 103 mmol/L (ref 101–111)
Creatinine, Ser: 1.04 mg/dL (ref 0.61–1.24)
GFR calc Af Amer: >60 mL/min (ref >60)
GFR calc non Af Amer: >60 mL/min (ref >60)
Glucose, Bld: 124 mg/dL — ABNORMAL HIGH (ref 65–99)
POTASSIUM: 4.4 mmol/L (ref 3.5–5.1)
SODIUM: 141 mmol/L (ref 135–145)

## 2016-12-06 LAB — CBC
HCT: 34.1 % — ABNORMAL LOW (ref 39.0–52.0)
HEMOGLOBIN: 9.9 g/dL — AB (ref 13.0–17.0)
MCH: 25.6 pg — ABNORMAL LOW (ref 26.0–34.0)
MCHC: 29 g/dL — AB (ref 30.0–36.0)
MCV: 88.3 fL (ref 78.0–100.0)
PLATELETS: 219 10*3/uL (ref 150–400)
RBC: 3.86 MIL/uL — AB (ref 4.22–5.81)
RDW: 17 % — ABNORMAL HIGH (ref 11.5–15.5)
WBC: 9.5 10*3/uL (ref 4.0–10.5)

## 2016-12-06 LAB — HEPARIN LEVEL (UNFRACTIONATED): Heparin Unfractionated: 0.79 IU/mL — ABNORMAL HIGH (ref 0.30–0.70)

## 2016-12-06 MED ORDER — FUROSEMIDE 10 MG/ML IJ SOLN: 80.0000 mg | Freq: Two times a day (BID) | INTRAMUSCULAR | Status: DC

## 2016-12-06 MED ORDER — DEXTROSE 5 % IV SOLN
2.0000 g | INTRAVENOUS | Status: DC
  Administered 2016-12-06: 2 g via INTRAVENOUS
  Filled 2016-12-06: qty 2

## 2016-12-06 MED ORDER — FUROSEMIDE 10 MG/ML IJ SOLN
40.0000 mg | Freq: Three times a day (TID) | INTRAMUSCULAR | Status: DC
  Filled 2016-12-06: qty 4

## 2016-12-08 ENCOUNTER — Telehealth: Payer: Self-pay

## 2016-12-08 NOTE — Procedures (Signed)
Pt took Bipap off and became combative, will not wear Bipap at this time. Placed pt on 10L HFNC, tolerating well, RT will monitor

## 2016-12-08 NOTE — Progress Notes (Signed)
ANTICOAGULATION CONSULT NOTE - Follow Up Consult  Pharmacy Consult for heparin Indication: atrial fibrillation  Labs:  Recent Labs  12/03/16 0701 12/03/16 1152  12/04/16 0352  12/05/16 0350 12/05/16 1400 12-27-2016 0359  HGB 10.5*  --   --  9.6*  --  9.5*  --  9.9*  HCT 34.2*  --   --  31.3*  --  32.2*  --  34.1*  PLT 208  --   --  204  --  215  --  219  APTT 59*  --   --   --   --   --   --   --   HEPARINUNFRC 0.27*  --   < >  --   < > 0.42 0.73* 0.79*  CREATININE 1.00  --   --  1.01  --  1.00  --  1.04  TROPONINI  --  0.03*  --   --   --   --   --   --   < > = values in this interval not displayed.   Assessment: 76yo male now above goal on heparin with increased level despite rate decrease; CBC stable, RN notes no signs of bleeding.  Goal of Therapy:  Heparin level 0.3-0.7 units/ml   Plan:  Will decrease heparin gtt by ~2 units/kg/hr to 1700 units/hr and check level in [place], PharmD, BCPS  Dec 27, 2016,4:46 AM

## 2016-12-08 NOTE — Telephone Encounter (Signed)
On 12/08/2016 I received a death certificate from [facility] (faxed). The death certificate is for cremation. The patient is a patient of Doctor Titus Mould. The death certificate will be taken to [facility] ([address]) this pm for signature.  On 2016/12/31 I received the death certificate back from Doctor Titus Mould. I got the death certificate ready and called the funeral home to let them know I faxed the death certificate to the funeral home per the funeral home request.

## 2016-12-08 NOTE — Progress Notes (Signed)
Family at bedside called RN, thought patient had passed. Death was pronounced after 2 nurses assessed patient Hassan Rowan [facility] and Court Joy, Chg RN). MD was notified.[facility] contacted.

## 2016-12-08 NOTE — Progress Notes (Signed)
SLP Cancellation Note  Patient Details Name: Mike Becker MRN: [id number] DOB: Dec 04, 1940   Cancelled treatment:       Reason Eval/Treat Not Completed: Patient not medically ready. RN reported pt not sounding well this morning. Will continue attempts.   Kern Reap, MA, CCC-SLP December 31, 2016, 8:47 AM

## 2016-12-08 NOTE — Progress Notes (Addendum)
PULMONARY / CRITICAL CARE MEDICINE   Name: Mike Becker MRN: [id number] DOB: 15-Jul-1940    ADMISSION DATE:  11/13/2016 CONSULTATION DATE:  11/12/2016  REFERRING MD:  Dr. Winfred Leeds  CHIEF COMPLAINT:  AMS  Brief:   76 yo male with PMH CAD s/p CABG 2004, COPD, HTN, Afib on xarelto and recent hospitalization 7/7-7/13 for MRSA bacteremia/endocarditis. Also had large right empyema which was drained in the setting of a R lung mass.  Presents 7/24 from SNF with AMS and multiple syncopal episodes. Upon arrival hypoxic in the low 80s with increased WOB, intubated in the ED.  SUBJECTIVE:  Net positive 1.5L Pt refusing BiPAP. Says he is short of breath, but currently on 10L HFNC with O2 sats >90% Productive cough  VITAL SIGNS: BP (!) 143/84 (BP Location: Left Leg)   Pulse (!) 136   Temp (!) 97.5 F (36.4 C) (Axillary)   Resp (!) 33   Ht 6' 3"  (1.905 m)   Wt 191 lb 2.2 oz (86.7 kg)   SpO2 100%   BMI 23.89 kg/m   INTAKE / OUTPUT: I/O last 3 completed shifts: In: 3768.3 [I.V.:755.8; NG/GT:2512.5; IV [id number]:546] Out: 2066 [Urine:1375; Emesis/NG output:100; Stool:1; Chest Tube:590]  PHYSICAL EXAMINATION:  General - groggy elderly gentleman lying in bed in no acute distress Eyes - pupils reactive ENT - NG tube in Cardiac - regular rhythm, tachycardic, no murmur, ?JVD elevation Chest - b/l crackles, Rt chest tube with 1.4L serosanguinous drainage Abd - soft, non tender, non-distended Ext - 1+ edema in bilateral feet, SCDs in place Skin - decreased erythema L antecubital area Neuro - normal strength  LABS:  BMET  Recent Labs Lab 12/04/16 0352 12/05/16 0350 12/20/2016 0359  NA 138 139 141  K 4.1 4.1 4.4  CL 99* 102 103  CO2 31 32 34*  BUN 21* 28* 29*  CREATININE 1.01 1.00 1.04  GLUCOSE 92 151* 124*    Electrolytes  Recent Labs Lab 12/04/16 0352 12/04/16 1350 12/05/16 0350 12/05/16 2021 2016/12/20 0359  CALCIUM 8.1*  --  8.2*  --  8.2*  MG  --  2.0 2.0 2.2   --   PHOS  --  3.8 3.8 3.4  --     CBC  Recent Labs Lab 12/04/16 0352 12/05/16 0350 2016-12-20 0359  WBC 7.1 8.3 9.5  HGB 9.6* 9.5* 9.9*  HCT 31.3* 32.2* 34.1*  PLT 204 215 219    Coag's  Recent Labs Lab 12/01/16 0008 12/01/16 0209 12/02/16 0223 12/03/16 0701  APTT 34  --  78* 59*  INR 1.73 1.44  --   --     Sepsis Markers  Recent Labs Lab 11/21/2016 1721 12/01/16 1500 12/02/16 0223  LATICACIDVEN 1.92* 2.2* 1.8    ABG  Recent Labs Lab 11/29/2016 1729 11/16/2016 1828 12/01/16 0002  PHART 7.235* 7.345* 7.401  PCO2ART 83.9* 63.4* 51.1*  PO2ART 85.0 288.0* 117.0*    Liver Enzymes  Recent Labs Lab 11/21/2016 1710  AST 29  ALT 23  ALKPHOS 172*  BILITOT 0.9  ALBUMIN 1.8*    Cardiac Enzymes  Recent Labs Lab 11/29/2016 2210 12/03/16 1152  TROPONINI <0.03 0.03*    Glucose  Recent Labs Lab 12/05/16 1207 12/05/16 1628 12/05/16 1940 12/05/16 2338 12-20-2016 0343 2016-12-20 0804  GLUCAP 145* 121* 164* 127* 123* 134*    Imaging Ct Chest W Contrast  Result Date: 12/05/2016 CLINICAL DATA:  Patient with newly diagnosed lung cancer. Follow-up evaluation. EXAM: CT CHEST WITH CONTRAST TECHNIQUE: Multidetector CT imaging  of the chest was performed during intravenous contrast administration. CONTRAST:  62m ISOVUE-300 IOPAMIDOL (ISOVUE-300) INJECTION 61% COMPARISON:  Chest CT 11/14/2016. FINDINGS: Cardiovascular: Heart is enlarged. No pericardial effusion. Main pulmonary artery is dilated measuring 3.8 cm. Aortic atherosclerosis. Ascending thoracic aorta dilated measuring 4.1 cm. Right upper extremity PICC line tip terminates in the superior vena cava. Mediastinum/Nodes: Interval increase in right supraclavicular adenopathy with a reference node measuring 5.0 x 3.4 cm (image 6; series 3), previously 2.6 x 2.0 cm. Right peritracheal nodal mass measures 5.5 x 3.1 cm (image 60; series 3), previously 3.9 x 2.3 cm. Interval increase in size of additional mediastinal and  hilar adenopathy. Lungs/Pleura: There is debris demonstrated within the distal trachea and opacifying the main right bronchus. Small amount of dependent debris within the left lung bronchi. Interval insertion of right-sided chest tube with decrease in now small right pleural effusion. Small amount of gas within the right pleural space adjacent to the chest tube. Interval increase in extensive masslike consolidation throughout the majority of the right lung particularly involving the right middle and right lower lobes. There are areas of central low attenuation most compatible with associated necrosis of known obscured large right lung mass. Patchy consolidative opacities within the aerated right upper lobe. Interval development of a moderate left pleural effusion with underlying consolidation within the left lower lobe and lingula. No pneumothorax. Emphysematous change. Upper Abdomen: Interval increase in size of left adrenal nodule measuring 2.5 cm (image 149; series 3), previously 2.1 cm. Enteric tube terminates within the stomach. Musculoskeletal: Thoracic spine degenerative changes. Age-indeterminate compression deformity of the T8 vertebral body. IMPRESSION: Interval progression of disease when compared to prior examination. There is increased masslike consolidation throughout the right hemithorax which obscures the known enlarging centrally necrotic mass within the right lung. Additionally there has been interval increase in size of right supraclavicular, mediastinal and hilar lymphadenopathy. Interval placement right chest tube with interval decrease in size of now small right pleural effusion. Small amount of gas within the right pleural space along the tract of the chest tube. 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 Interval development of moderate left pleural effusion. Aortic Atherosclerosis (ICD10-I70.0) and Emphysema (ICD10-J43.9). Age-indeterminate  compression deformity of the T8 vertebral body. Recommend correlation for point tenderness. Electronically Signed   By: DLovey NewcomerM.D.   On: 12/05/2016 18:00     STUDIES:  CT chest 7/8> Large complex diffusely heterogeneous mass at R hilium 8.8 x 8.1 x 6.5 cm, with multiple areas of central necrosis c/w primary bronchogenic malignancy. Enlarged mediastinal nodes. Scattered small left-sided pulmonary nodules. TEE 7/11 > 55-60%, wall motion normal, small highly mobile vegetations on aortic and mitral valves CXR 7/24 > Large right pleural effusion with near complete passive atelectasis in the right lung. Areas of atelectasis and/or airspace consolidation in the left lower lobe. RUQ [place]7/24 > Increased hepatic echogenicity suggesting fatty infiltration or hepatocellular disease. No focal abnormality. TTE 7/25 > EF 60-65%, Moderate Concentric Hypertrophy, systolic function normal, normal wall motion.  CT chest 7/29 > increased masslike consolidation throughout R hemithorax obscuring known enlarging centrally necrotic mass in R lung; interval increase in size of R supraclavicular, mediastinal, and hilar lymphadenopathy. Increased size of L adrenal nodule, concerning for adrenal metastasis. Moderate L pleural effusion  CULTURES: Blood Cx 7/24 > neg Sputum 7/24 >>  Few candida albicans Pleural fluid Cx 7/25 > negative Pleural fluid cytology 7/25 > nonmalignant  ANTIBIOTICS: Vanc 7/8 > with  stop date of 8/18  Zosyn 7/24 >   SIGNIFICANT EVENTS: 7/24 > admitted, intubated  7/25 > Thoracentesis, extubated 7/26 > concern for aspiration 7/27 > failed swallow evaluation 7/28 > palliative care consulted, cortrak placed  LINES/TUBES: R UE PICC 7/13 > ETT 7/24 > 7/25 Rt chest tube 7/26 >   DISCUSSION: 76yo male with recently hospitalization for MRSA bacteremia and endocarditis who presented with AMS 2/2 hypercarbia, acute respiratory failure from a large R pleural effusion in the setting of known  R lung mass.  ASSESSMENT / PLAN:  PULMONARY A: Acute on chronic hypoxic/hypercapnic respiratory failure from HCAP, empyema Lung mass with LAN and adrenal nodule >> most likely advanced lung cancer. Hx of COPD S/p R chest tube with 390cc drainage P: Consider IR consult to assess for additional chest tube, or if he needs tPA/DNAse Continue chest tube to -20 cm suction Bipap prn-- refusing Oxygen to keep SpO2 > 90% DNI Explained he is not a candidate for any interventions for presumed lung cancer until his infections are resolved Albuterol PRN IS as able lasix  CARDIOVASCULAR A:  A fib with RVR Hx of CAD s/p CABG 2004, Thoracic aortic aneurysm P: Heparin gtt per pharmacy Continue metoprolol, amiodarone ?lasix  RENAL A:   edema P:   Follow BMET lasix  GASTROINTESTINAL A:   Dysphagia. Moderate protein calorie malnutrition. P:   Continue tube feeds- held for asp cocnern F/u with speech therapy  HEMATOLOGIC A:   Anemia of critical illness, Hb stable at 9.5>9.9 WBC stable P:  Follow CBC SCDs Heparin gtt  INFECTIOUS A:   MRSA endocarditis and bacteremia HCAP with Rt empyema Lt antecubital cellulitis P: Day 6/14 zosyn Continue vancomycin through 8/18  ENDOCRINE A:   Hyperglycemia Concern for adrenal met on CT P:   SSI  NEUROLOGIC A:   Acute metabolic encephalopathy >> much improved P:   Monitor mental status  GOALS OF CARE - DNR/DNI - continue current medical therapies - palliative care consulted  Colbert Ewing, MD Internal Medicine, PGY-1  STAFF NOTE: I, Merrie Roof, MD FACP have personally reviewed patient's available data, including medical history, events of note, physical examination and test results as part of my evaluation. I have discussed with resident/NP and other care providers such as pharmacist, RN and RRT. In addition, I personally evaluated patient and elicited key findings of: awake, alert, int coughing and distress,  ronchi and reduced BS rt lung, CT which I reviewed shows growth of mass and nodes and reduction in effusion with chest tube, PCXR also I reviewed shows continued infiltrate rt base and reduction in effusion with chest tube, he is suffering,m I do not feel any heroics acls, mech vent etc would be beneficial, he is dying and refusing NIMV, dc NIMV as asp risk only, holding T Fas also asp risk now , maintain vanc to stop date for endocarditis, narrow zosyn to ctx to total 14 days for empyema, will discuss now with family to transition to comfort  Care fully, max neg balance as able with lasix, chem in am , heparin likely only risk, will discuss with pt and family The patient is critically ill with multiple organ systems failure and requires high complexity decision making for assessment and support, frequent evaluation and titration of therapies, application of advanced monitoring technologies and extensive interpretation of multiple databases.   Critical Care Time devoted to patient care services described in this note is 30 Minutes. This time reflects time of care of this signee:  Merrie Roof, MD FACP. This critical care time does not reflect procedure time, or teaching time or supervisory time of PA/NP/Med student/Med Resident etc but could involve care discussion time. Rest per NP/medical resident whose note is outlined above and that I agree with   Lavon Paganini. Titus Mould, MD, [place] Pgr: [facility] 2016/12/13 9:58 AM   Update: D/w son No code full . He wants NO NIMV He want s comfort for any distress if/when noted To floor  Lavon Paganini. Titus Mould, MD, [place] Pgr: [facility]

## 2016-12-08 DEATH — deceased

## 2016-12-13 ENCOUNTER — Telehealth: Payer: Self-pay

## 2016-12-13 NOTE — Telephone Encounter (Signed)
On 12/13/2016 I received a death certificate from [facility] (original). The death certificate is for cremation. The patient is a patient of Doctor Titus Mould. The death certificate will be taken to [facility] ([phone]) Monday 12/20/2016 since Doctor Titus Mould is on vacation until then and he signed the faxed copy.  On 2016-12-30 I received the death certificate back from Doctor Titus Mould. I got the death certificate ready and called the funeral home to let them know [employer] [employer] [facility] [employer] [facility] will come by to pickup the death certificate.

## 2016-12-16 ENCOUNTER — Ambulatory Visit: Payer: Medicare PPO | Admitting: Internal Medicine

## 2017-01-08 NOTE — Discharge Summary (Addendum)
Death NOte  76 yo male with PMH CAD s/p CABG 2004, COPD, HTN, Afib on xarelto and recent hospitalization 7/7-7/13 for MRSA bacteremia/endocarditis. Also had large right empyema which was drained in the setting of a R lung mass.  Presents 7/24 from SNF with AMS and multiple syncopal episodes. Upon arrival hypoxic in the low 80s with increased WOB, intubated in the ED Net positive 1.5L Pt refusing BiPAP. Says he is short of breath, but currently on 10L HFNC with O2 sats >90% Productive cough  Pt did not want heroics. SOn opted for full comfort care and he expired  Final Dx pon death  1. Lung cancer 2. Lung collapse from mass 3. Empyema s/p chest tube placement 4. PNA 5. sepsis  Lavon Paganini. Titus Mould, MD, [place] Pgr: [facility]

## 2017-11-16 IMAGING — CR DG CHEST 1V PORT
1 series · 1 of 1 positions shown · non-contrast
Comparison: 12/02/2016.

CLINICAL DATA: Empyema.

EXAM:
PORTABLE CHEST 1 VIEW

[AP]
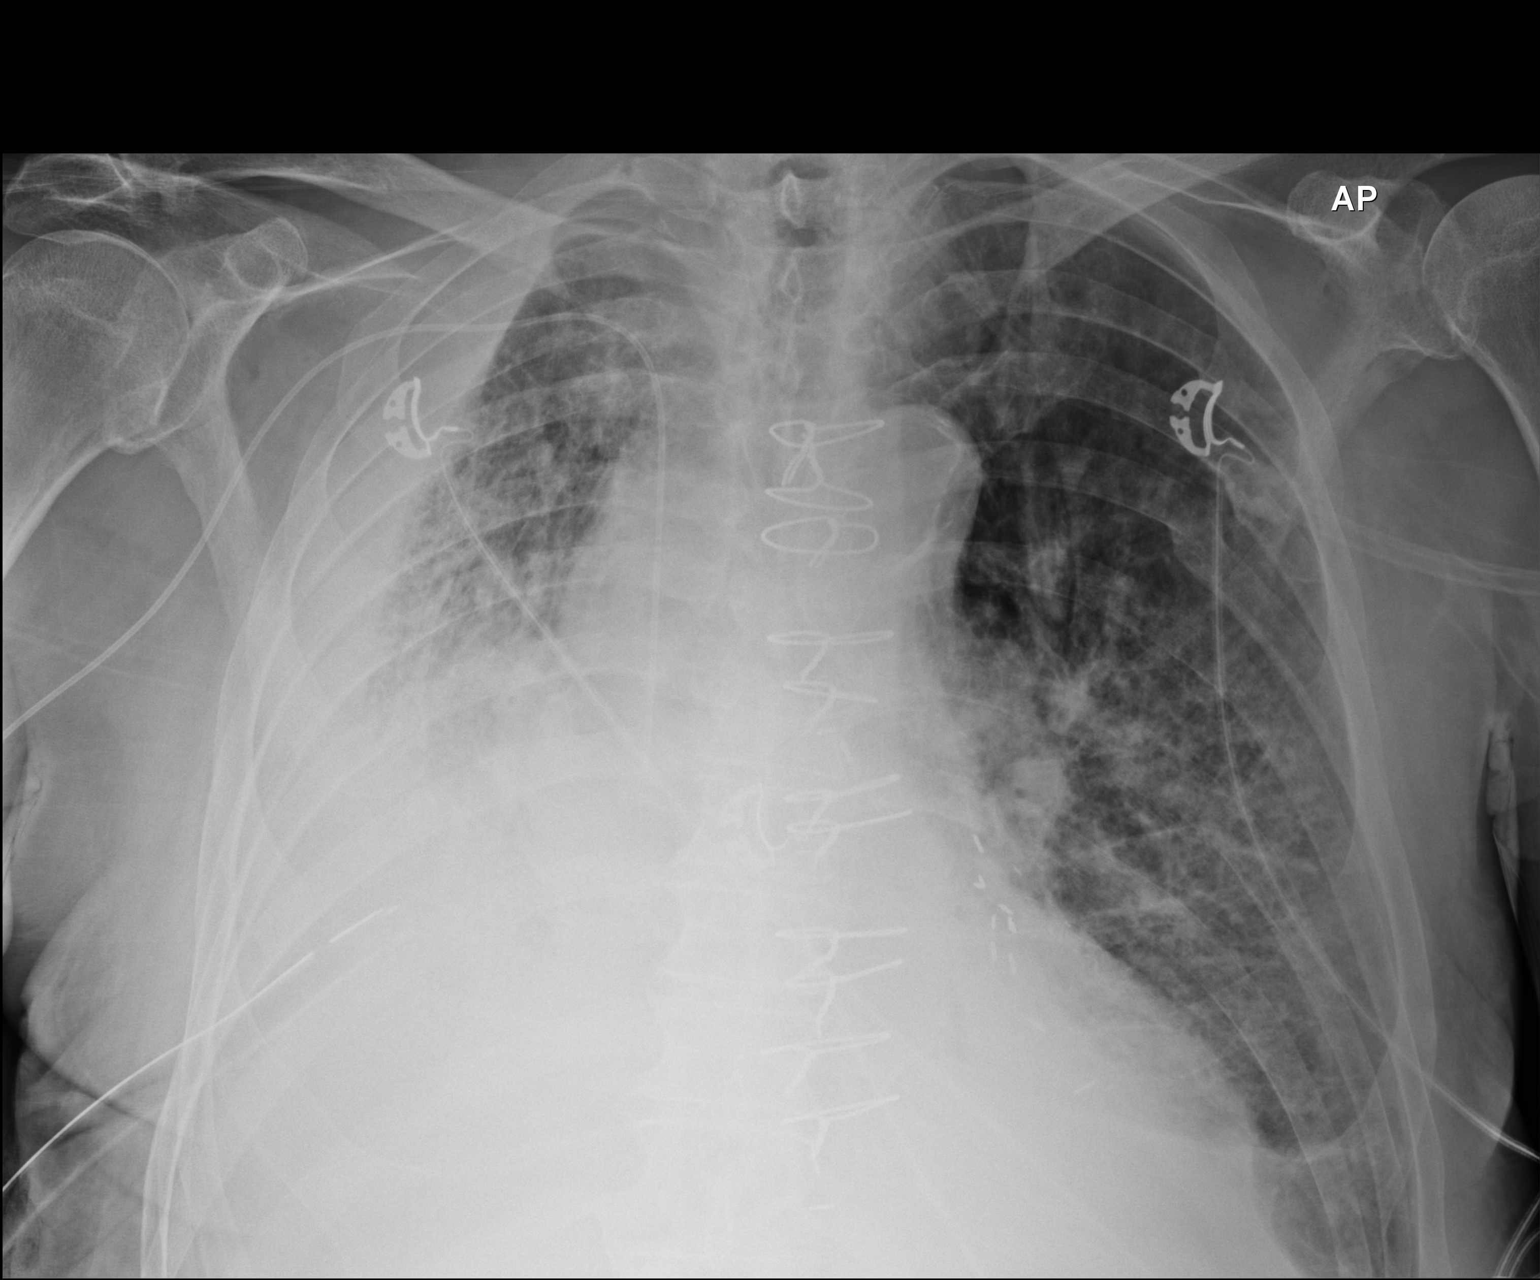

[1 of 1 positions shown; findings below may reference images not displayed]

FINDINGS: Right PICC line and right chest tube in stable position. Prior CABG.
Heart size stable. Persistent diffuse right lung interstitial
prominence. Progressive left lung interstitial prominence. CHF
cannot be excluded. Progressive right-sided pleural effusion with
volume loss right lung.
IMPRESSION: 1. Right PICC line and right chest tube in stable position.
Progressive right-sided pleural effusion. Large right pleural
effusion noted on today's exam. Volume loss right lung.

2. Prior CABG. Heart size stable. Persistent diffuse right lung
interstitial prominence. Progressive left lung interstitial
prominence. CHF cannot be excluded.

## 2017-11-18 IMAGING — DX DG CHEST 1V PORT
1 series · 1 of 1 positions shown · non-contrast
Comparison: 12/04/2016

CLINICAL DATA: Empyema

EXAM:
PORTABLE CHEST 1 VIEW

[chest ap]
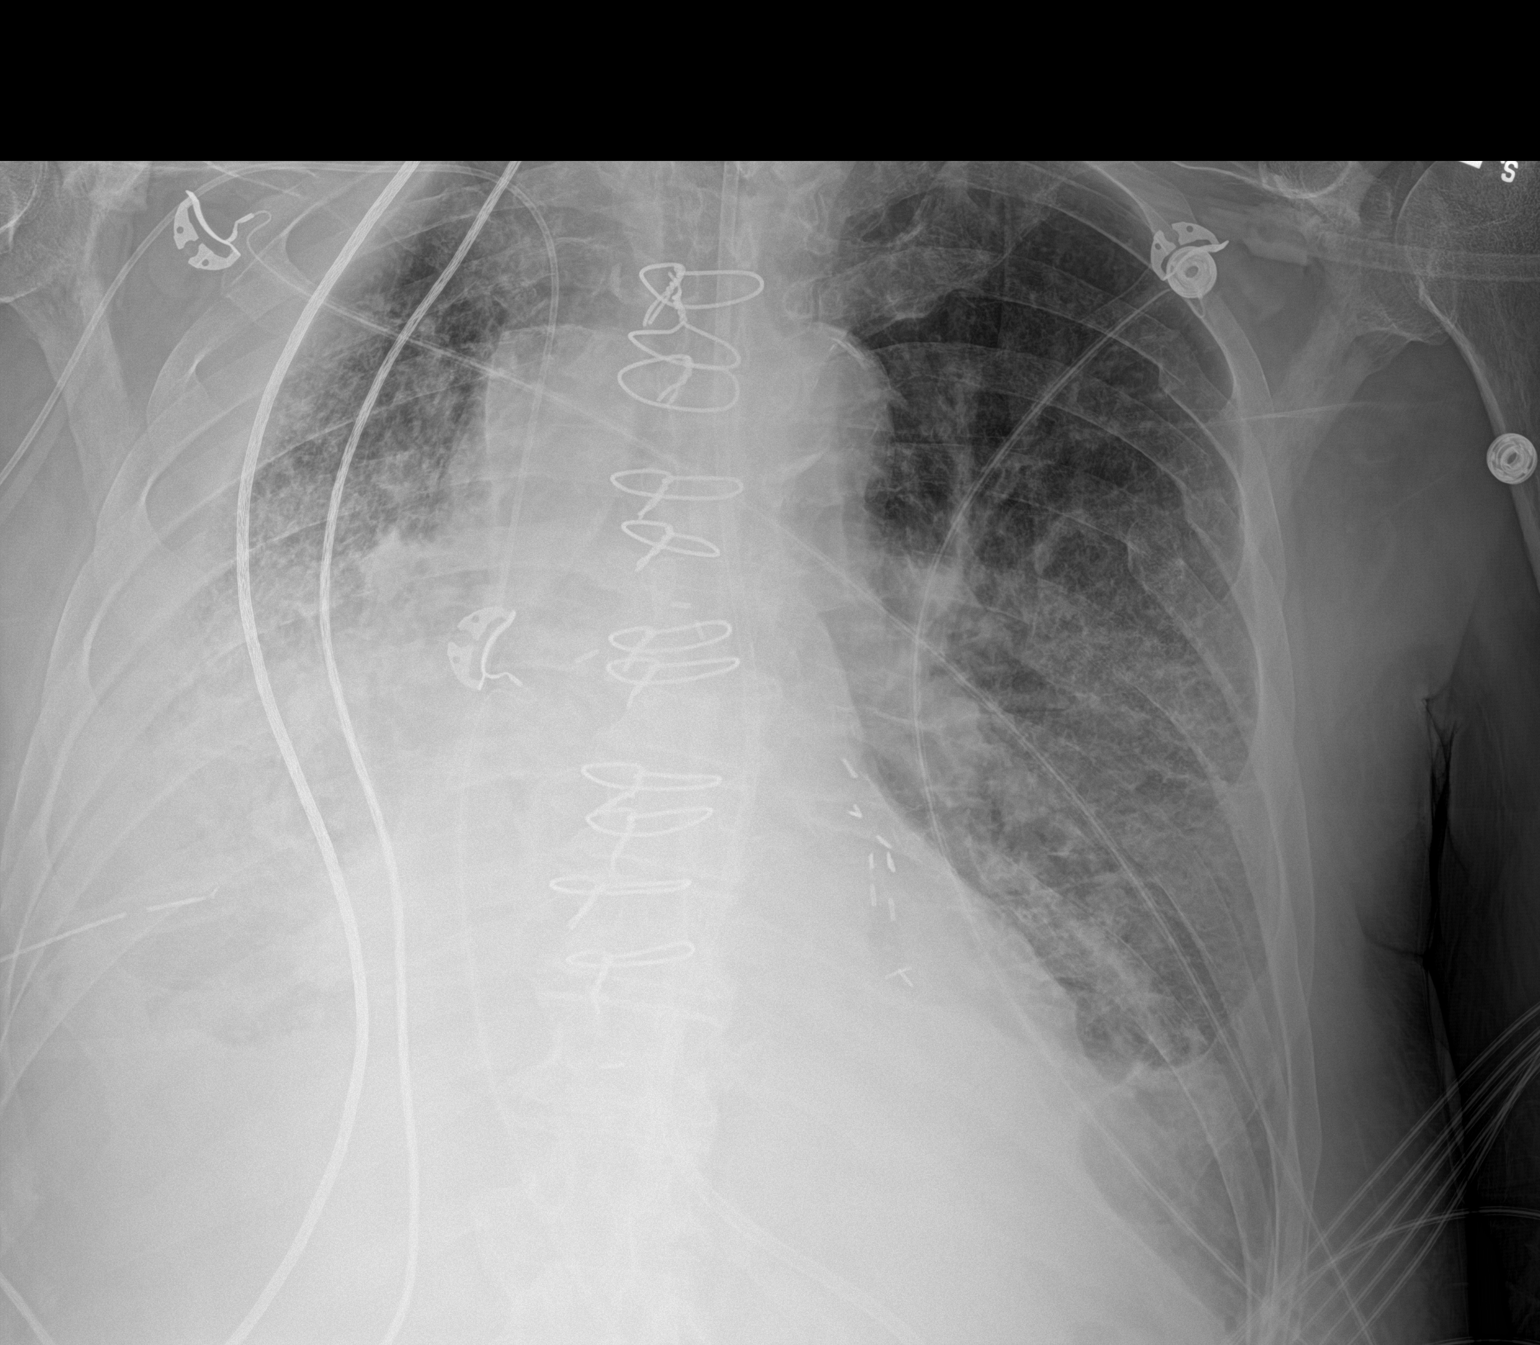

[1 of 1 positions shown; findings below may reference images not displayed]

FINDINGS: Right basilar chest tube and right PICC line remain in place,
unchanged. No pneumothorax. Continued moderate to large right
effusion and diffuse airspace opacities bilaterally, right greater
than left. Underlying COPD. Heart is mildly enlarged.
IMPRESSION: No significant change since prior study.  No pneumothorax.
